# Patient Record
Sex: Female | Born: 1965 | Hispanic: Yes | Marital: Single | Smoking: Never smoker
Health system: Southern US, Community
[De-identification: ages and names within clinical notes are randomized; demographics above are authoritative.]

## PROBLEM LIST (undated history)

## (undated) DIAGNOSIS — Z789 Other specified health status: Secondary | ICD-10-CM

## (undated) HISTORY — PX: CHOLECYSTECTOMY: SHX55

---

## 1991-03-01 HISTORY — PX: WRIST SURGERY: SHX841

## 2010-02-28 NOTE — L&D Delivery Note (Addendum)
Delivery Note At 9:01 AM a viable female was delivered via Vaginal, Spontaneous Delivery (Presentation: Right Occiput Anterior).  APGAR: 9, 9; weight: 6lb5oz .   Placenta status: Intact, Spontaneous.  Cord: 3 vessel cord. No complications with shoulders. Fundus firm after massage.  Anesthesia: None  Episiotomy: None Lacerations: 1st degree;Vaginal Suture Repair: 3.0 vicryl Est. Blood Loss (mL): 350  Mom to postpartum.  Baby to nursery-stable.  Marena Chancy 09/28/2010, 9:26 AM  I was present for the delivery.  Agree with above note by PGY1.   Maryelizabeth Kaufmann MD 09/28/2010

## 2010-05-04 ENCOUNTER — Other Ambulatory Visit: Payer: Self-pay | Admitting: Family Medicine

## 2010-05-04 DIAGNOSIS — Z0489 Encounter for examination and observation for other specified reasons: Secondary | ICD-10-CM

## 2010-05-13 ENCOUNTER — Ambulatory Visit (HOSPITAL_COMMUNITY)
Admission: RE | Admit: 2010-05-13 | Discharge: 2010-05-13 | Disposition: A | Discharge status: Home / Self Care | Payer: Medicaid Other | Source: Ambulatory Visit | Attending: Family Medicine | Admitting: Family Medicine

## 2010-05-13 ENCOUNTER — Other Ambulatory Visit (HOSPITAL_COMMUNITY): Payer: Self-pay | Admitting: Family Medicine

## 2010-05-13 ENCOUNTER — Other Ambulatory Visit: Payer: Self-pay | Admitting: Family Medicine

## 2010-05-13 ENCOUNTER — Other Ambulatory Visit: Payer: Self-pay

## 2010-05-13 DIAGNOSIS — O09529 Supervision of elderly multigravida, unspecified trimester: Secondary | ICD-10-CM | POA: Insufficient documentation

## 2010-05-13 LAB — POCT URINALYSIS DIPSTICK
Nitrite: NEGATIVE
Protein, ur: NEGATIVE mg/dL
Specific Gravity, Urine: 1.02 (ref 1.005–1.030)
Urobilinogen, UA: 0.2 mg/dL (ref 0.0–1.0)

## 2010-05-14 LAB — ABO/RH: RH Type: POSITIVE

## 2010-05-14 LAB — TYPE AND SCREEN: Antibody Screen: NEGATIVE

## 2010-05-18 ENCOUNTER — Ambulatory Visit (HOSPITAL_COMMUNITY): Payer: Medicaid Other

## 2010-05-20 ENCOUNTER — Ambulatory Visit: Payer: Medicaid Other

## 2010-05-20 DIAGNOSIS — O09219 Supervision of pregnancy with history of pre-term labor, unspecified trimester: Secondary | ICD-10-CM

## 2010-05-27 ENCOUNTER — Ambulatory Visit: Payer: Medicaid Other

## 2010-05-27 DIAGNOSIS — O09529 Supervision of elderly multigravida, unspecified trimester: Secondary | ICD-10-CM

## 2010-05-27 DIAGNOSIS — Z331 Pregnant state, incidental: Secondary | ICD-10-CM

## 2010-06-03 ENCOUNTER — Ambulatory Visit: Payer: Medicaid Other

## 2010-06-10 ENCOUNTER — Other Ambulatory Visit: Payer: Self-pay | Admitting: Obstetrics & Gynecology

## 2010-06-10 DIAGNOSIS — O09219 Supervision of pregnancy with history of pre-term labor, unspecified trimester: Secondary | ICD-10-CM

## 2010-06-10 LAB — POCT URINALYSIS DIP (DEVICE)
Glucose, UA: NEGATIVE mg/dL
Protein, ur: NEGATIVE mg/dL
Specific Gravity, Urine: >=1.03 (ref 1.005–1.030)
pH: 6 (ref 5.0–8.0)

## 2010-06-17 ENCOUNTER — Ambulatory Visit: Payer: Self-pay

## 2010-06-17 DIAGNOSIS — O09219 Supervision of pregnancy with history of pre-term labor, unspecified trimester: Secondary | ICD-10-CM

## 2010-06-24 ENCOUNTER — Ambulatory Visit (HOSPITAL_COMMUNITY)
Admission: RE | Admit: 2010-06-24 | Discharge: 2010-06-24 | Disposition: A | Discharge status: Home / Self Care | Payer: BC Managed Care – PPO | Source: Ambulatory Visit | Attending: Family Medicine | Admitting: Family Medicine

## 2010-06-24 ENCOUNTER — Other Ambulatory Visit: Payer: Self-pay | Admitting: Family Medicine

## 2010-06-24 ENCOUNTER — Encounter (HOSPITAL_COMMUNITY): Payer: Self-pay

## 2010-06-24 DIAGNOSIS — O09529 Supervision of elderly multigravida, unspecified trimester: Secondary | ICD-10-CM

## 2010-06-24 DIAGNOSIS — O3400 Maternal care for unspecified congenital malformation of uterus, unspecified trimester: Secondary | ICD-10-CM

## 2010-06-24 DIAGNOSIS — Z8751 Personal history of pre-term labor: Secondary | ICD-10-CM | POA: Insufficient documentation

## 2010-06-24 DIAGNOSIS — Z331 Pregnant state, incidental: Secondary | ICD-10-CM

## 2010-06-24 DIAGNOSIS — O09219 Supervision of pregnancy with history of pre-term labor, unspecified trimester: Secondary | ICD-10-CM

## 2010-06-24 DIAGNOSIS — O09299 Supervision of pregnancy with other poor reproductive or obstetric history, unspecified trimester: Secondary | ICD-10-CM

## 2010-06-29 ENCOUNTER — Inpatient Hospital Stay (HOSPITAL_COMMUNITY): Admission: AD | Admit: 2010-06-29 | Payer: Self-pay | Source: Ambulatory Visit | Admitting: Obstetrics & Gynecology

## 2010-07-01 ENCOUNTER — Ambulatory Visit: Payer: Self-pay

## 2010-07-02 ENCOUNTER — Ambulatory Visit: Payer: Self-pay

## 2010-07-02 DIAGNOSIS — Z331 Pregnant state, incidental: Secondary | ICD-10-CM

## 2010-07-02 DIAGNOSIS — O09219 Supervision of pregnancy with history of pre-term labor, unspecified trimester: Secondary | ICD-10-CM

## 2010-07-08 ENCOUNTER — Other Ambulatory Visit: Payer: Self-pay | Admitting: Obstetrics & Gynecology

## 2010-07-08 DIAGNOSIS — Z331 Pregnant state, incidental: Secondary | ICD-10-CM

## 2010-07-08 DIAGNOSIS — O09219 Supervision of pregnancy with history of pre-term labor, unspecified trimester: Secondary | ICD-10-CM

## 2010-07-08 LAB — POCT URINALYSIS DIP (DEVICE)
Hgb urine dipstick: NEGATIVE
Ketones, ur: NEGATIVE mg/dL
Protein, ur: NEGATIVE mg/dL
Specific Gravity, Urine: >=1.03 (ref 1.005–1.030)
Urobilinogen, UA: 0.2 mg/dL (ref 0.0–1.0)

## 2010-07-15 ENCOUNTER — Ambulatory Visit: Payer: Medicaid Other

## 2010-07-15 DIAGNOSIS — O09219 Supervision of pregnancy with history of pre-term labor, unspecified trimester: Secondary | ICD-10-CM

## 2010-07-22 ENCOUNTER — Other Ambulatory Visit (HOSPITAL_COMMUNITY): Payer: Self-pay

## 2010-07-22 ENCOUNTER — Ambulatory Visit (HOSPITAL_COMMUNITY): Payer: Self-pay

## 2010-07-23 ENCOUNTER — Ambulatory Visit: Payer: Self-pay

## 2010-07-23 DIAGNOSIS — O09219 Supervision of pregnancy with history of pre-term labor, unspecified trimester: Secondary | ICD-10-CM

## 2010-07-29 ENCOUNTER — Other Ambulatory Visit: Payer: Self-pay | Admitting: Obstetrics & Gynecology

## 2010-07-29 ENCOUNTER — Ambulatory Visit (HOSPITAL_COMMUNITY)
Admission: RE | Admit: 2010-07-29 | Discharge: 2010-07-29 | Disposition: A | Discharge status: Home / Self Care | Payer: BC Managed Care – PPO | Source: Ambulatory Visit | Attending: Obstetrics & Gynecology | Admitting: Obstetrics & Gynecology

## 2010-07-29 ENCOUNTER — Ambulatory Visit (HOSPITAL_COMMUNITY): Payer: BC Managed Care – PPO

## 2010-07-29 ENCOUNTER — Other Ambulatory Visit: Payer: Self-pay | Admitting: Obstetrics and Gynecology

## 2010-07-29 DIAGNOSIS — O3400 Maternal care for unspecified congenital malformation of uterus, unspecified trimester: Secondary | ICD-10-CM | POA: Insufficient documentation

## 2010-07-29 DIAGNOSIS — O09219 Supervision of pregnancy with history of pre-term labor, unspecified trimester: Secondary | ICD-10-CM

## 2010-07-29 DIAGNOSIS — O09529 Supervision of elderly multigravida, unspecified trimester: Secondary | ICD-10-CM | POA: Insufficient documentation

## 2010-07-29 DIAGNOSIS — Z8751 Personal history of pre-term labor: Secondary | ICD-10-CM | POA: Insufficient documentation

## 2010-07-29 DIAGNOSIS — O09299 Supervision of pregnancy with other poor reproductive or obstetric history, unspecified trimester: Secondary | ICD-10-CM

## 2010-07-29 LAB — POCT URINALYSIS DIP (DEVICE)
Bilirubin Urine: NEGATIVE
Glucose, UA: NEGATIVE mg/dL
Nitrite: NEGATIVE
Specific Gravity, Urine: 1.01 (ref 1.005–1.030)
Urobilinogen, UA: 0.2 mg/dL (ref 0.0–1.0)
pH: 6.5 (ref 5.0–8.0)

## 2010-08-05 ENCOUNTER — Ambulatory Visit (HOSPITAL_COMMUNITY)
Admission: RE | Admit: 2010-08-05 | Discharge: 2010-08-05 | Disposition: A | Discharge status: Home / Self Care | Payer: BC Managed Care – PPO | Source: Ambulatory Visit | Attending: Family Medicine | Admitting: Family Medicine

## 2010-08-05 DIAGNOSIS — O09299 Supervision of pregnancy with other poor reproductive or obstetric history, unspecified trimester: Secondary | ICD-10-CM

## 2010-08-05 DIAGNOSIS — O09219 Supervision of pregnancy with history of pre-term labor, unspecified trimester: Secondary | ICD-10-CM

## 2010-08-05 DIAGNOSIS — O09529 Supervision of elderly multigravida, unspecified trimester: Secondary | ICD-10-CM | POA: Insufficient documentation

## 2010-08-05 DIAGNOSIS — O3400 Maternal care for unspecified congenital malformation of uterus, unspecified trimester: Secondary | ICD-10-CM | POA: Insufficient documentation

## 2010-08-05 DIAGNOSIS — Z8751 Personal history of pre-term labor: Secondary | ICD-10-CM | POA: Insufficient documentation

## 2010-08-05 LAB — POCT URINALYSIS DIP (DEVICE)
Bilirubin Urine: NEGATIVE
Hgb urine dipstick: NEGATIVE
Ketones, ur: NEGATIVE mg/dL
Protein, ur: NEGATIVE mg/dL
pH: 5.5 (ref 5.0–8.0)

## 2010-08-06 ENCOUNTER — Other Ambulatory Visit: Payer: Self-pay | Admitting: Family Medicine

## 2010-08-06 DIAGNOSIS — O3400 Maternal care for unspecified congenital malformation of uterus, unspecified trimester: Secondary | ICD-10-CM

## 2010-08-06 DIAGNOSIS — O09529 Supervision of elderly multigravida, unspecified trimester: Secondary | ICD-10-CM

## 2010-08-06 DIAGNOSIS — Z8751 Personal history of pre-term labor: Secondary | ICD-10-CM

## 2010-08-12 ENCOUNTER — Ambulatory Visit: Payer: Self-pay

## 2010-08-12 ENCOUNTER — Other Ambulatory Visit (HOSPITAL_COMMUNITY): Payer: Self-pay

## 2010-08-12 DIAGNOSIS — O09219 Supervision of pregnancy with history of pre-term labor, unspecified trimester: Secondary | ICD-10-CM

## 2010-08-19 ENCOUNTER — Other Ambulatory Visit (HOSPITAL_COMMUNITY): Payer: Self-pay

## 2010-08-20 ENCOUNTER — Other Ambulatory Visit: Payer: Self-pay

## 2010-08-20 DIAGNOSIS — O09529 Supervision of elderly multigravida, unspecified trimester: Secondary | ICD-10-CM

## 2010-08-20 DIAGNOSIS — O09219 Supervision of pregnancy with history of pre-term labor, unspecified trimester: Secondary | ICD-10-CM

## 2010-08-20 DIAGNOSIS — O364XX0 Maternal care for intrauterine death, not applicable or unspecified: Secondary | ICD-10-CM

## 2010-08-26 ENCOUNTER — Ambulatory Visit (HOSPITAL_COMMUNITY): Payer: Self-pay

## 2010-08-26 ENCOUNTER — Other Ambulatory Visit: Payer: Self-pay | Admitting: Obstetrics & Gynecology

## 2010-08-26 ENCOUNTER — Other Ambulatory Visit (HOSPITAL_COMMUNITY): Payer: Self-pay

## 2010-08-26 ENCOUNTER — Ambulatory Visit (HOSPITAL_COMMUNITY): Payer: BC Managed Care – PPO | Attending: Family Medicine

## 2010-08-26 DIAGNOSIS — O09219 Supervision of pregnancy with history of pre-term labor, unspecified trimester: Secondary | ICD-10-CM

## 2010-08-26 DIAGNOSIS — O09529 Supervision of elderly multigravida, unspecified trimester: Secondary | ICD-10-CM

## 2010-08-30 ENCOUNTER — Other Ambulatory Visit: Payer: BC Managed Care – PPO

## 2010-08-30 DIAGNOSIS — O09299 Supervision of pregnancy with other poor reproductive or obstetric history, unspecified trimester: Secondary | ICD-10-CM

## 2010-08-30 DIAGNOSIS — O09529 Supervision of elderly multigravida, unspecified trimester: Secondary | ICD-10-CM

## 2010-09-02 ENCOUNTER — Other Ambulatory Visit: Payer: BC Managed Care – PPO

## 2010-09-02 ENCOUNTER — Other Ambulatory Visit: Payer: Self-pay | Admitting: Obstetrics & Gynecology

## 2010-09-02 DIAGNOSIS — IMO0002 Reserved for concepts with insufficient information to code with codable children: Secondary | ICD-10-CM

## 2010-09-02 DIAGNOSIS — O09299 Supervision of pregnancy with other poor reproductive or obstetric history, unspecified trimester: Secondary | ICD-10-CM

## 2010-09-02 DIAGNOSIS — O09219 Supervision of pregnancy with history of pre-term labor, unspecified trimester: Secondary | ICD-10-CM

## 2010-09-02 DIAGNOSIS — O09529 Supervision of elderly multigravida, unspecified trimester: Secondary | ICD-10-CM

## 2010-09-06 ENCOUNTER — Ambulatory Visit (INDEPENDENT_AMBULATORY_CARE_PROVIDER_SITE_OTHER): Payer: BC Managed Care – PPO | Admitting: Family Medicine

## 2010-09-06 DIAGNOSIS — O09299 Supervision of pregnancy with other poor reproductive or obstetric history, unspecified trimester: Secondary | ICD-10-CM

## 2010-09-06 DIAGNOSIS — O09529 Supervision of elderly multigravida, unspecified trimester: Secondary | ICD-10-CM

## 2010-09-09 ENCOUNTER — Other Ambulatory Visit: Payer: Self-pay | Admitting: Obstetrics and Gynecology

## 2010-09-09 ENCOUNTER — Ambulatory Visit (INDEPENDENT_AMBULATORY_CARE_PROVIDER_SITE_OTHER): Payer: BC Managed Care – PPO | Admitting: Family Medicine

## 2010-09-09 DIAGNOSIS — O09529 Supervision of elderly multigravida, unspecified trimester: Secondary | ICD-10-CM

## 2010-09-09 DIAGNOSIS — O09219 Supervision of pregnancy with history of pre-term labor, unspecified trimester: Secondary | ICD-10-CM

## 2010-09-09 DIAGNOSIS — O09299 Supervision of pregnancy with other poor reproductive or obstetric history, unspecified trimester: Secondary | ICD-10-CM

## 2010-09-13 ENCOUNTER — Other Ambulatory Visit: Payer: BC Managed Care – PPO

## 2010-09-16 ENCOUNTER — Other Ambulatory Visit: Payer: Self-pay | Admitting: Obstetrics & Gynecology

## 2010-09-16 ENCOUNTER — Ambulatory Visit: Payer: BC Managed Care – PPO | Admitting: *Deleted

## 2010-09-16 DIAGNOSIS — O09299 Supervision of pregnancy with other poor reproductive or obstetric history, unspecified trimester: Secondary | ICD-10-CM

## 2010-09-16 DIAGNOSIS — O09529 Supervision of elderly multigravida, unspecified trimester: Secondary | ICD-10-CM

## 2010-09-16 DIAGNOSIS — O09219 Supervision of pregnancy with history of pre-term labor, unspecified trimester: Secondary | ICD-10-CM

## 2010-09-16 LAB — POCT URINALYSIS DIP (DEVICE)
Ketones, ur: NEGATIVE mg/dL
Protein, ur: NEGATIVE mg/dL
Specific Gravity, Urine: 1.015 (ref 1.005–1.030)
Urobilinogen, UA: 0.2 mg/dL (ref 0.0–1.0)
pH: 6 (ref 5.0–8.0)

## 2010-09-20 ENCOUNTER — Ambulatory Visit: Payer: BC Managed Care – PPO | Admitting: *Deleted

## 2010-09-20 ENCOUNTER — Other Ambulatory Visit: Payer: BC Managed Care – PPO

## 2010-09-20 DIAGNOSIS — O368190 Decreased fetal movements, unspecified trimester, not applicable or unspecified: Secondary | ICD-10-CM

## 2010-09-20 DIAGNOSIS — O09299 Supervision of pregnancy with other poor reproductive or obstetric history, unspecified trimester: Secondary | ICD-10-CM

## 2010-09-20 DIAGNOSIS — O09529 Supervision of elderly multigravida, unspecified trimester: Secondary | ICD-10-CM

## 2010-09-20 LAB — US OB COMP LESS 14 WKS

## 2010-09-23 ENCOUNTER — Ambulatory Visit: Payer: BC Managed Care – PPO | Admitting: *Deleted

## 2010-09-23 ENCOUNTER — Other Ambulatory Visit: Payer: Self-pay | Admitting: Obstetrics & Gynecology

## 2010-09-23 DIAGNOSIS — O09299 Supervision of pregnancy with other poor reproductive or obstetric history, unspecified trimester: Secondary | ICD-10-CM

## 2010-09-23 DIAGNOSIS — IMO0002 Reserved for concepts with insufficient information to code with codable children: Secondary | ICD-10-CM

## 2010-09-27 ENCOUNTER — Ambulatory Visit: Payer: BC Managed Care – PPO | Admitting: *Deleted

## 2010-09-27 DIAGNOSIS — O09529 Supervision of elderly multigravida, unspecified trimester: Secondary | ICD-10-CM

## 2010-09-27 DIAGNOSIS — O09299 Supervision of pregnancy with other poor reproductive or obstetric history, unspecified trimester: Secondary | ICD-10-CM

## 2010-09-28 ENCOUNTER — Inpatient Hospital Stay (HOSPITAL_COMMUNITY)
Admission: AD | Admit: 2010-09-28 | Discharge: 2010-09-29 | DRG: 373 | Disposition: A | Discharge status: Home / Self Care | Payer: BC Managed Care – PPO | Source: Ambulatory Visit | Attending: Obstetrics and Gynecology | Admitting: Obstetrics and Gynecology

## 2010-09-28 ENCOUNTER — Encounter (HOSPITAL_COMMUNITY): Payer: Self-pay

## 2010-09-28 DIAGNOSIS — O09529 Supervision of elderly multigravida, unspecified trimester: Secondary | ICD-10-CM

## 2010-09-28 DIAGNOSIS — Z3480 Encounter for supervision of other normal pregnancy, unspecified trimester: Secondary | ICD-10-CM

## 2010-09-28 HISTORY — DX: Other specified health status: Z78.9

## 2010-09-28 LAB — CBC
HCT: 40.7 % (ref 36.0–46.0)
Platelets: 232 10*3/uL (ref 150–400)
RBC: 4.7 MIL/uL (ref 3.87–5.11)
RDW: 13.1 % (ref 11.5–15.5)
WBC: 10.6 10*3/uL — ABNORMAL HIGH (ref 4.0–10.5)

## 2010-09-28 MED ORDER — IBUPROFEN 600 MG PO TABS
600.0000 mg | ORAL_TABLET | Freq: Four times a day (QID) | ORAL | Status: DC
  Administered 2010-09-28 – 2010-09-29 (×5): 600 mg via ORAL
  Filled 2010-09-28 (×5): qty 1

## 2010-09-28 MED ORDER — DIPHENHYDRAMINE HCL 25 MG PO CAPS: 25.0000 mg | ORAL_CAPSULE | Freq: Four times a day (QID) | ORAL | Status: DC | PRN

## 2010-09-28 MED ORDER — LACTATED RINGERS IV SOLN
INTRAVENOUS | Status: DC
  Administered 2010-09-28 (×2): via INTRAVENOUS

## 2010-09-28 MED ORDER — TETANUS-DIPHTH-ACELL PERTUSSIS 5-2.5-18.5 LF-MCG/0.5 IM SUSP
0.5000 mL | Freq: Once | INTRAMUSCULAR | Status: AC
  Administered 2010-09-29: 0.5 mL via INTRAMUSCULAR
  Filled 2010-09-28: qty 0.5

## 2010-09-28 MED ORDER — SODIUM CHLORIDE 0.9 % IJ SOLN: 3.0000 mL | Freq: Two times a day (BID) | INTRAMUSCULAR | Status: DC

## 2010-09-28 MED ORDER — SODIUM CHLORIDE 0.9 % IJ SOLN: 3.0000 mL | INTRAMUSCULAR | Status: DC | PRN

## 2010-09-28 MED ORDER — BENZOCAINE-MENTHOL 20-0.5 % EX AERO: 1.0000 "application " | INHALATION_SPRAY | CUTANEOUS | Status: DC | PRN

## 2010-09-28 MED ORDER — BENZOCAINE-MENTHOL 20-0.5 % EX AERO
INHALATION_SPRAY | CUTANEOUS | Status: AC
  Administered 2010-09-28: 13:00:00
  Filled 2010-09-28: qty 56

## 2010-09-28 MED ORDER — FLEET ENEMA 7-19 GM/118ML RE ENEM: 1.0000 | ENEMA | RECTAL | Status: DC | PRN

## 2010-09-28 MED ORDER — SODIUM CHLORIDE 0.9 % IV SOLN: 250.0000 mL | INTRAVENOUS | Status: DC

## 2010-09-28 MED ORDER — OXYCODONE-ACETAMINOPHEN 5-325 MG PO TABS: 1.0000 | ORAL_TABLET | ORAL | Status: DC | PRN

## 2010-09-28 MED ORDER — LIDOCAINE HCL (PF) 1 % IJ SOLN
INTRAMUSCULAR | Status: AC
  Filled 2010-09-28: qty 30

## 2010-09-28 MED ORDER — FERROUS SULFATE 325 (65 FE) MG PO TABS
325.0000 mg | ORAL_TABLET | Freq: Two times a day (BID) | ORAL | Status: DC
  Administered 2010-09-28 – 2010-09-29 (×2): 325 mg via ORAL
  Filled 2010-09-28 (×2): qty 1

## 2010-09-28 MED ORDER — LIDOCAINE HCL (PF) 1 % IJ SOLN
30.0000 mL | INTRAMUSCULAR | Status: DC | PRN
  Administered 2010-09-28: 30 mL via SUBCUTANEOUS

## 2010-09-28 MED ORDER — ONDANSETRON HCL 4 MG/2ML IJ SOLN: 4.0000 mg | Freq: Four times a day (QID) | INTRAMUSCULAR | Status: DC | PRN

## 2010-09-28 MED ORDER — OXYCODONE-ACETAMINOPHEN 5-325 MG PO TABS: 2.0000 | ORAL_TABLET | ORAL | Status: DC | PRN

## 2010-09-28 MED ORDER — PRENATAL PLUS 27-1 MG PO TABS
1.0000 | ORAL_TABLET | Freq: Every day | ORAL | Status: DC
  Administered 2010-09-29: 1 via ORAL
  Filled 2010-09-28: qty 1

## 2010-09-28 MED ORDER — LACTATED RINGERS IV SOLN: 500.0000 mL | INTRAVENOUS | Status: DC | PRN

## 2010-09-28 MED ORDER — WITCH HAZEL-GLYCERIN EX PADS: MEDICATED_PAD | CUTANEOUS | Status: DC | PRN

## 2010-09-28 MED ORDER — OXYTOCIN 20 UNITS IN LACTATED RINGERS INFUSION - SIMPLE
125.0000 mL/h | Freq: Once | INTRAVENOUS | Status: AC
  Administered 2010-09-28: 125 mL/h via INTRAVENOUS

## 2010-09-28 MED ORDER — SENNOSIDES-DOCUSATE SODIUM 8.6-50 MG PO TABS
1.0000 | ORAL_TABLET | Freq: Every day | ORAL | Status: DC
  Administered 2010-09-28: 1 via ORAL

## 2010-09-28 MED ORDER — SIMETHICONE 80 MG PO CHEW
80.0000 mg | CHEWABLE_TABLET | ORAL | Status: DC | PRN
  Administered 2010-09-29: 80 mg via ORAL

## 2010-09-28 MED ORDER — IBUPROFEN 600 MG PO TABS: 600.0000 mg | ORAL_TABLET | Freq: Four times a day (QID) | ORAL | Status: DC | PRN

## 2010-09-28 MED ORDER — OXYTOCIN 20 UNITS IN LACTATED RINGERS INFUSION - SIMPLE
INTRAVENOUS | Status: AC
  Filled 2010-09-28: qty 1000

## 2010-09-28 MED ORDER — ACETAMINOPHEN 325 MG PO TABS: 650.0000 mg | ORAL_TABLET | ORAL | Status: DC | PRN

## 2010-09-28 MED ORDER — LANOLIN HYDROUS EX OINT: TOPICAL_OINTMENT | CUTANEOUS | Status: DC | PRN

## 2010-09-28 MED ORDER — CITRIC ACID-SODIUM CITRATE 334-500 MG/5ML PO SOLN: 30.0000 mL | ORAL | Status: DC | PRN

## 2010-09-28 NOTE — H&P (Signed)
Janet Gentry is a 45 y.o. female [phone] with IUP at [redacted]w[redacted]d presenting for active labor . Pt states she has been having regular, every 2-3 minutes, associated with spotting vaginal bleeding, ruptured, clear fluid, @ 0620, with active.  She desires to unsure of pp contraception.  [facility] 21 w[facility]  Prenatal History/Complications: AMA Obesity H/o IUFD at 28 wks s/p 17-P  Past Medical History: Past Medical History  Diagnosis Date  . Preterm labor   . No pertinent past medical history     Past Surgical History: Past Surgical History  Procedure Date  . Wrist surgery 1993    Obstetrical History: OB History    Grav Para Term Preterm Abortions TAB SAB Ect Mult Living   5 4 3 1      3       Gynecological History: No STIs. Or HSV  Social History: History   Social History  . Marital Status: Single    Spouse Name: N/A    Number of Children: N/A  . Years of Education: N/A   Social History Main Topics  . Smoking status: Never Smoker   . Smokeless tobacco: Never Used  . Alcohol Use: No  . Drug Use: No  . Sexually Active: Yes   Other Topics Concern  . None   Social History Narrative  . None    Family History: Family History  Problem Relation Age of Onset  . Hypertension Father   . Stroke Father   . Hypertension Maternal Grandmother     Allergies: No Known Allergies  No prescriptions prior to admission    Review of Systems - General: no fevers HEENT: no headaches Cardiac: no chest pain, no edema GI: no nausea, no vomiting GU: positive for contractions, some vaginal bleeding, LOF    Blood pressure 139/80, pulse 101, temperature 97.9 F (36.6 C), temperature source Oral, resp. rate 22, height 5' (1.524 m), weight 89.359 kg (197 lb). General appearance: alert and cooperative Head: Normocephalic, without obvious abnormality, atraumatic Lungs: clear to auscultation bilaterally Heart: regular rate and rhythm, S1, S2 normal, no murmur, click, rub or  gallop Abdomen: gravid, nontender Extremities: no edema, redness or tenderness in the calves or thighs cephalic Baseline: 150 bpm, Variability: Good {> 6 bpm), Accelerations: Reactive and Decelerations: Variable: mild Frequency: Every 2-3 minutes Dilation 10cm Effacement 100% Station 0   Prenatal labs: ABO, Rh:  A+ Antibody:   negative Rubella:  immune RPR:   non reactive HBsAg:    negative HIV:   negative GBS: NEGATIVE (07/19 0839)  1 hr Glucola 120 Genetic screening normal Anatomy US normal   Assessment: Janet Gentry is a 45 y.o. [id number] with an IUP at [redacted]w[redacted]d presenting in active labor  Plan: Anticipate SVD, actively pushing.  Tavi Hoogendoorn 09/28/2010, 8:39 AM

## 2010-09-28 NOTE — Progress Notes (Addendum)
Infant recently fed per mom 8-10 minutes. . Discussed importance of checking latch /encouraged to call .

## 2010-09-28 NOTE — Progress Notes (Signed)
At 0630, water broke pinkish clear fluid, ctx's now q3 minutes apart. GBS negative.

## 2010-09-29 MED ORDER — IBUPROFEN 600 MG PO TABS: 600.0000 mg | ORAL_TABLET | Freq: Four times a day (QID) | ORAL | Status: AC

## 2010-09-29 MED ORDER — DOCUSATE SODIUM 100 MG PO CAPS: 100.0000 mg | ORAL_CAPSULE | Freq: Two times a day (BID) | ORAL | Status: AC | PRN

## 2010-09-29 NOTE — Discharge Summary (Signed)
Obstetric Discharge Summary Reason for Admission: onset of labor Prenatal Procedures: none Intrapartum Procedures: spontaneous vaginal delivery Postpartum Procedures: none Complications-Operative and Postpartum: 1st degree vaginal laceration repaired Hemoglobin  Date Value Range Status  09/28/2010 14.3  12.0-15.0 (g/dL) Final     HCT  Date Value Range Status  09/28/2010 40.7  36.0-46.0 (%) Final    Discharge Diagnoses: Term Pregnancy-delivered  Discharge Information: Date: 09/29/2010 Activity: pelvic rest Diet: routine Medications: PNV, Ibuprophen and Colace Condition: stable Instructions: refer to practice specific booklet Discharge to: home Breast feeding Contraception: depo Follow-up Information    Follow up with [facility]. Make an appointment in 2 weeks. (for only depo injection and then 6 week postpartum check)    Contact information:   [address] [phone]          Newborn Data: Live born female  Birth Weight: 6 lb 5.6 oz (2880 g) APGAR: 9, 9 Circumcision prior to discharge.  Home with mother.  Janet Gentry 09/29/2010, 7:50 AM

## 2010-09-29 NOTE — Progress Notes (Signed)
Post Partum Day 1 Subjective: no complaints and tolerating PO, normal lochia, present BM, present flatus, plans to breastfeed, Depo-Provera  Objective: Blood pressure 119/79, pulse 85, temperature 97.6 F (36.4 C), temperature source Oral, resp. rate 18, height 5' (1.524 m), weight 89.359 kg (197 lb), SpO2 98.00%, unknown if currently breastfeeding.  Physical Exam:  General: alert and cooperative Lochia: appropriate Chest: CTAB Heart: RRR no m/r/g Abdomen: +BS, soft, nontender,  Uterine Fundus: firm at umbilicus DVT Evaluation: No evidence of DVT seen on physical exam. Extremities: no c/c/e   Basename 09/28/10 0740  HGB 14.3  HCT 40.7    Assessment/Plan: Discharge home pt desires early discharge.  Hold discharge if baby not d/c'd   LOS: 1 day   Janet Gentry 09/29/2010, 7:45 AM

## 2010-09-29 NOTE — Progress Notes (Signed)
Mom reports that baby is nursing well most feedings. Does better on the left breast. Manual pump given with instructions- going back to work in 8 Janet Gentry. Plans to call [facility] about a pump for back to work. No questions at present. Reviewed engorgement protocol especially on right breast.

## 2010-09-30 ENCOUNTER — Ambulatory Visit (HOSPITAL_COMMUNITY): Admission: RE | Admit: 2010-09-30 | Payer: BC Managed Care – PPO | Source: Ambulatory Visit

## 2010-09-30 ENCOUNTER — Other Ambulatory Visit: Payer: BC Managed Care – PPO

## 2010-10-07 ENCOUNTER — Encounter (HOSPITAL_COMMUNITY): Payer: BC Managed Care – PPO

## 2010-11-10 ENCOUNTER — Encounter: Payer: Self-pay | Admitting: Advanced Practice Midwife

## 2010-11-10 ENCOUNTER — Ambulatory Visit (INDEPENDENT_AMBULATORY_CARE_PROVIDER_SITE_OTHER): Payer: BC Managed Care – PPO | Admitting: Family Medicine

## 2010-11-10 VITALS — BP 122/86 | HR 85 | Temp 98.7°F | Ht 60.0 in | Wt 180.4 lb

## 2010-11-10 DIAGNOSIS — IMO0001 Reserved for inherently not codable concepts without codable children: Secondary | ICD-10-CM

## 2010-11-10 DIAGNOSIS — Z3049 Encounter for surveillance of other contraceptives: Secondary | ICD-10-CM

## 2010-11-10 LAB — POCT PREGNANCY, URINE: Preg Test, Ur: NEGATIVE

## 2010-11-10 MED ORDER — MEDROXYPROGESTERONE ACETATE 150 MG/ML IM SUSP
150.0000 mg | INTRAMUSCULAR | Status: AC
  Administered 2010-11-10: 150 mg via INTRAMUSCULAR

## 2010-11-10 NOTE — Progress Notes (Signed)
  Subjective:    Patient ID: Janet Gentry, female    DOB: January 01, 1966, 45 y.o.   MRN: [id number]  HPI Janet Gentry is a 45 y.o. female who presents for a postpartum visit. She is 6 weeks postpartum following a spontaneous vaginal delivery. I have fully reviewed the prenatal and intrapartum course. Postpartum course has been normal. Baby's course has been normal. Baby is feeding by breast. Bleeding staining only. Bowel function is normal. Bladder function is normal. Patient is not sexually active. Contraception method is Depo-Provera injections. Postpartum depression screening: negative.  The following portions of the patient's history were reviewed and updated as appropriate: past family history, past surgical history and problem list.    Review of Systems A comprehensive review of systems was negative.     Objective:   Physical Exam   BP 122/86  Pulse 85  Temp(Src) 98.7 F (37.1 C) (Oral)  Ht 5' (1.524 m)  Wt 180 lb 6.4 oz (81.829 kg)  BMI 35.23 kg/m2  Breastfeeding? Yes  General:  alert  Abdomen: soft, non-tender; bowel sounds normal; no masses,  no organomegaly   Vulva:  normal  Vagina: normal vagina  Corpus: normal size, contour, position, consistency, mobility, non-tender  Adnexa:  normal adnexa         Assessment & Plan:  Normal postpartum exam.  1. Contraception: Depo-Provera injections 2. Follow up in: 1 year or as needed.

## 2011-02-02 ENCOUNTER — Ambulatory Visit: Payer: BC Managed Care – PPO

## 2012-04-06 ENCOUNTER — Emergency Department (HOSPITAL_COMMUNITY)
Admission: EM | Admit: 2012-04-06 | Discharge: 2012-04-06 | Disposition: A | Discharge status: Home / Self Care | Payer: BC Managed Care – PPO | Attending: Emergency Medicine | Admitting: Emergency Medicine

## 2012-04-06 ENCOUNTER — Emergency Department (HOSPITAL_COMMUNITY): Payer: BC Managed Care – PPO

## 2012-04-06 ENCOUNTER — Encounter (HOSPITAL_COMMUNITY): Payer: Self-pay | Admitting: Neurology

## 2012-04-06 DIAGNOSIS — Z8751 Personal history of pre-term labor: Secondary | ICD-10-CM | POA: Insufficient documentation

## 2012-04-06 DIAGNOSIS — R079 Chest pain, unspecified: Secondary | ICD-10-CM

## 2012-04-06 DIAGNOSIS — M79609 Pain in unspecified limb: Secondary | ICD-10-CM | POA: Insufficient documentation

## 2012-04-06 DIAGNOSIS — Z3202 Encounter for pregnancy test, result negative: Secondary | ICD-10-CM | POA: Insufficient documentation

## 2012-04-06 DIAGNOSIS — R072 Precordial pain: Secondary | ICD-10-CM | POA: Insufficient documentation

## 2012-04-06 LAB — URINALYSIS, ROUTINE W REFLEX MICROSCOPIC
Bilirubin Urine: NEGATIVE
Hgb urine dipstick: NEGATIVE
Specific Gravity, Urine: 1.008 (ref 1.005–1.030)
Urobilinogen, UA: 0.2 mg/dL (ref 0.0–1.0)

## 2012-04-06 LAB — CBC WITH DIFFERENTIAL/PLATELET
Basophils Absolute: 0 10*3/uL (ref 0.0–0.1)
Basophils Relative: 0 % (ref 0–1)
Eosinophils Absolute: 0.4 10*3/uL (ref 0.0–0.7)
Lymphs Abs: 2.3 10*3/uL (ref 0.7–4.0)
MCH: 30.7 pg (ref 26.0–34.0)
Neutrophils Relative %: 59 % (ref 43–77)
Platelets: 233 10*3/uL (ref 150–400)
RBC: 4.66 MIL/uL (ref 3.87–5.11)
RDW: 12.1 % (ref 11.5–15.5)

## 2012-04-06 LAB — BASIC METABOLIC PANEL
Calcium: 9 mg/dL (ref 8.4–10.5)
GFR calc Af Amer: >90 mL/min (ref >90)
GFR calc non Af Amer: >90 mL/min (ref >90)
Glucose, Bld: 98 mg/dL (ref 70–99)
Potassium: 3.6 mEq/L (ref 3.5–5.1)
Sodium: 138 mEq/L (ref 135–145)

## 2012-04-06 LAB — URINE MICROSCOPIC-ADD ON

## 2012-04-06 LAB — TROPONIN I: Troponin I: <0.3 ng/mL (ref <0.30)

## 2012-04-06 MED ORDER — METOPROLOL TARTRATE 25 MG PO TABS: 100.0000 mg | ORAL_TABLET | Freq: Once | ORAL | Status: DC

## 2012-04-06 NOTE — ED Notes (Signed)
Per ems- Last night pt states nausea no cp. This morning ate breakfast, while driving had sudden onset of upper center pressure radiation to left arm 5/10. States SOB. Last 10 mins when arrive at work pain sudsided. BP 170/90 initial. EKG SR unremarkable, given 324 aspirin , 20 gauge to right hand. 100% 2 L. Painfree at this time. Alert and oriented. CBG 133, HR 90.

## 2012-04-06 NOTE — ED Notes (Signed)
Patient transported to X-ray 

## 2012-04-06 NOTE — ED Provider Notes (Signed)
History     CSN: [id number]  Arrival date & time 04/06/12  0751   First MD Initiated Contact with Patient 04/06/12 [phone]      Chief Complaint  Patient presents with  . Chest Pain    (Consider location/radiation/quality/duration/timing/severity/associated sxs/prior treatment) HPI Pt reports last night she felt nauseated but did not have any vomiting. She was feeling better this morning, but on her way to work began to have moderate midsternal chest pressure with aching in L arm. No SOB, no nausea. Pain lasted about and resolved. She is pain free now. EMS gave her ASA. No prior history of same. No known HTN, DM or CAD. Does not smoke. PERC neg.   Past Medical History  Diagnosis Date  . Preterm labor   . No pertinent past medical history     Past Surgical History  Procedure Date  . Wrist surgery 1993    Family History  Problem Relation Age of Onset  . Hypertension Father   . Stroke Father   . Hypertension Maternal Grandmother     History  Substance Use Topics  . Smoking status: Never Smoker   . Smokeless tobacco: Never Used  . Alcohol Use: No    OB History    Grav Para Term Preterm Abortions TAB SAB Ect Mult Living   5 5 4 1      4       Review of Systems All other systems reviewed and are negative except as noted in HPI.   Allergies  Review of patient's allergies indicates no known allergies.  Home Medications   Current Outpatient Rx  Name  Route  Sig  Dispense  Refill  . PRENATAL PLUS 27-1 MG PO TABS   Oral   Take 1 tablet by mouth daily.             BP 116/63  Pulse 93  Temp 97.9 F (36.6 C) (Oral)  Resp 16  Ht 5\' 2"  (1.575 m)  Wt 180 lb (81.647 kg)  BMI 32.92 kg/m2  SpO2 98%  LMP 03/14/2012  Physical Exam  Nursing note and vitals reviewed. Constitutional: She is oriented to person, place, and time. She appears well-developed and well-nourished.  HENT:  Head: Normocephalic and atraumatic.  Eyes: EOM are normal. Pupils are equal,  round, and reactive to light.  Neck: Normal range of motion. Neck supple.  Cardiovascular: Normal rate, normal heart sounds and intact distal pulses.   Pulmonary/Chest: Effort normal and breath sounds normal.  Abdominal: Bowel sounds are normal. She exhibits no distension. There is no tenderness.  Musculoskeletal: Normal range of motion. She exhibits no edema and no tenderness.  Neurological: She is alert and oriented to person, place, and time. She has normal strength. No cranial nerve deficit or sensory deficit.  Skin: Skin is warm and dry. No rash noted.  Psychiatric: She has a normal mood and affect.    ED Course  Procedures (including critical care time)  Labs Reviewed  URINALYSIS, ROUTINE W REFLEX MICROSCOPIC - Abnormal; Notable for the following:    Leukocytes, UA TRACE (*)     All other components within normal limits  URINE MICROSCOPIC-ADD ON - Abnormal; Notable for the following:    Squamous Epithelial / LPF FEW (*)     All other components within normal limits  CBC WITH DIFFERENTIAL  BASIC METABOLIC PANEL  TROPONIN I  PREGNANCY, URINE  TROPONIN I   Dg Chest 2 View  04/06/2012  *RADIOLOGY REPORT*  Clinical Data:  Chest pain and pressure.  CHEST - 2 VIEW  Comparison: None.  Findings: The heart size and pulmonary vascularity are normal and the lungs are clear.  No osseous abnormality.  IMPRESSION: Normal chest.   Original Report Authenticated By: Francene Boyers, M.D.      No diagnosis found.    MDM   Date: 04/06/2012  Rate: 82  Rhythm: normal sinus rhythm  QRS Axis: normal  Intervals: normal  ST/T Wave abnormalities: normal  Conduction Disutrbances: none  Narrative Interpretation: unremarkable  3:03 PM Pt remains pain free. Troponin neg x2. Low risk for CAD, I attempted to obtain CT Coronary today but after several delays it seems apparent that the test will not be done until tomorrow morning. The patient does not want to stay in the ED and will follow up with her  PCP. Advised to return for worsening.          Charles B. Bernette Mayers, MD 04/06/12 1504

## 2012-04-06 NOTE — ED Notes (Signed)
Phlebotomy at bedside to draw troponin.

## 2012-04-06 NOTE — ED Notes (Signed)
Pt resting talking with family at bedside

## 2012-04-06 NOTE — ED Notes (Signed)
Calculated BMI is 32.9  

## 2012-04-06 NOTE — ED Notes (Signed)
Family at bedside. 

## 2012-04-14 ENCOUNTER — Other Ambulatory Visit: Payer: Self-pay

## 2012-11-26 IMAGING — US US OB FOLLOW-UP
1 series · 14 of 28 positions shown · non-contrast
Comparison: none

[Series 1: us ob follow-up · 14 of 55 slices shown]
[im 3/55]
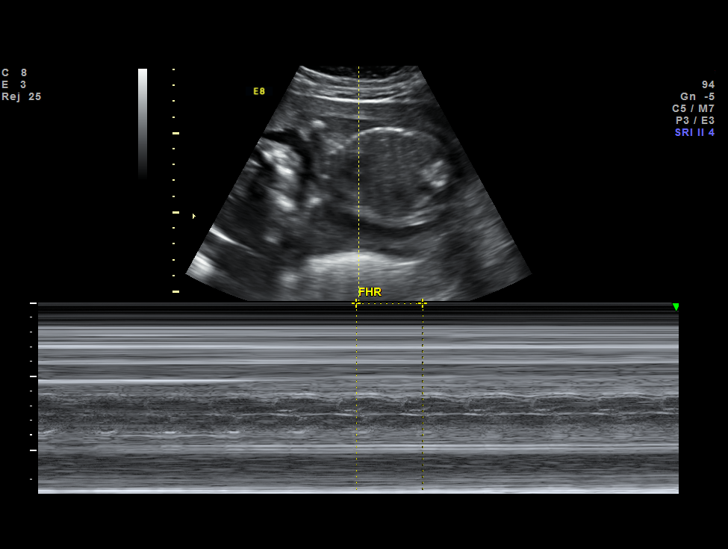
[im 7/55]
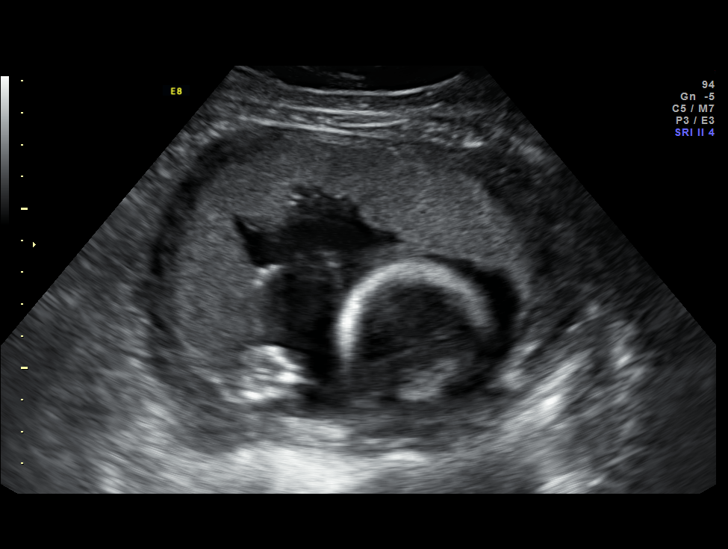
[im 11/55]
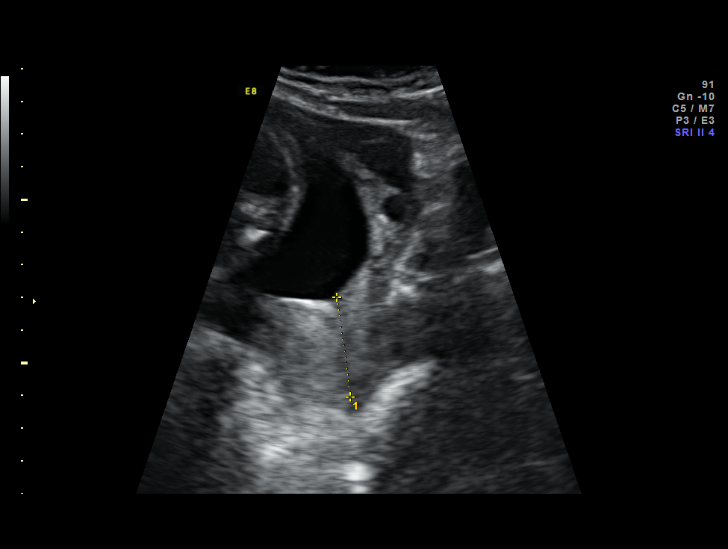
[im 15/55]
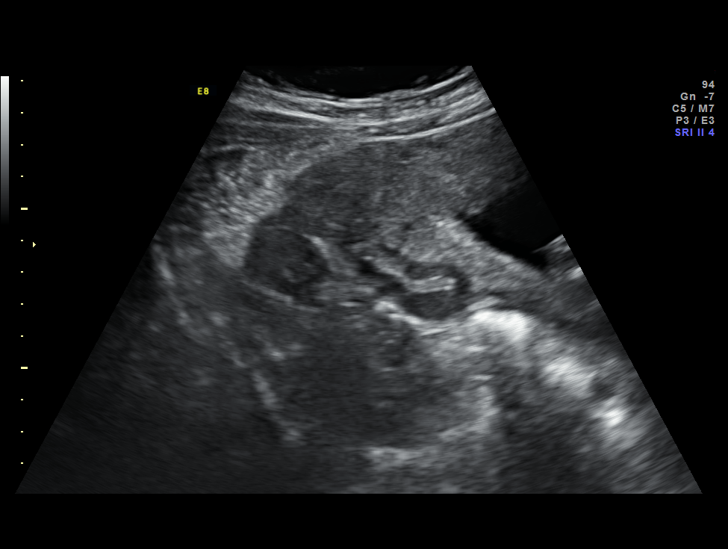
[im 19/55]
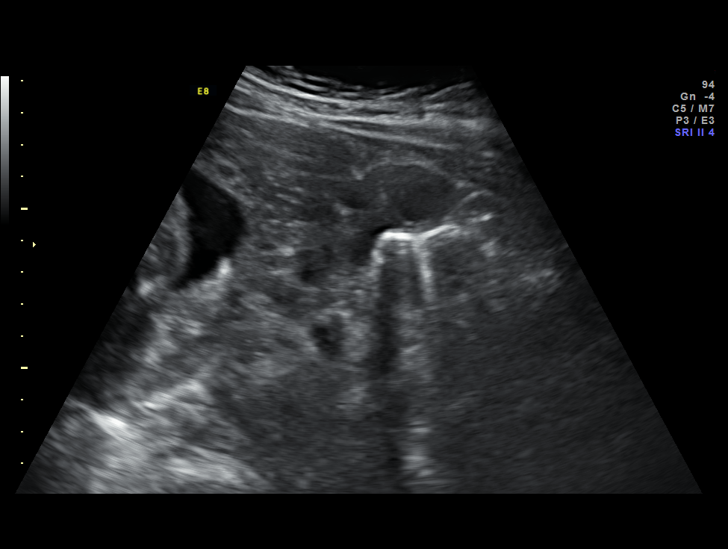
[im 23/55]
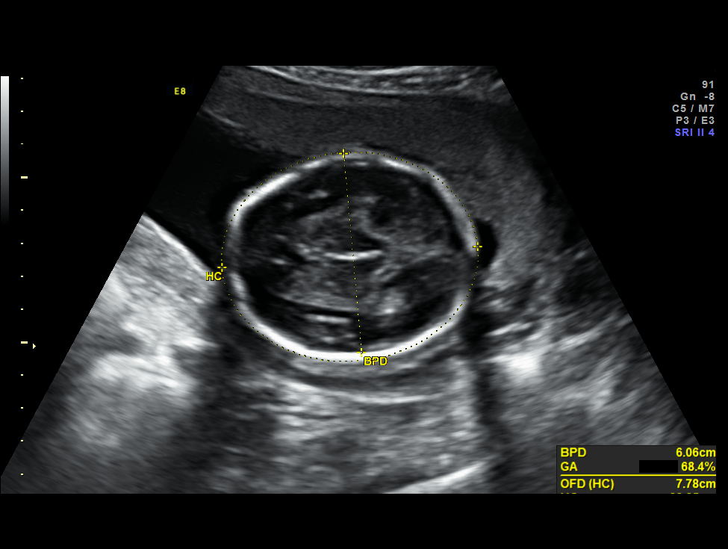
[im 27/55]
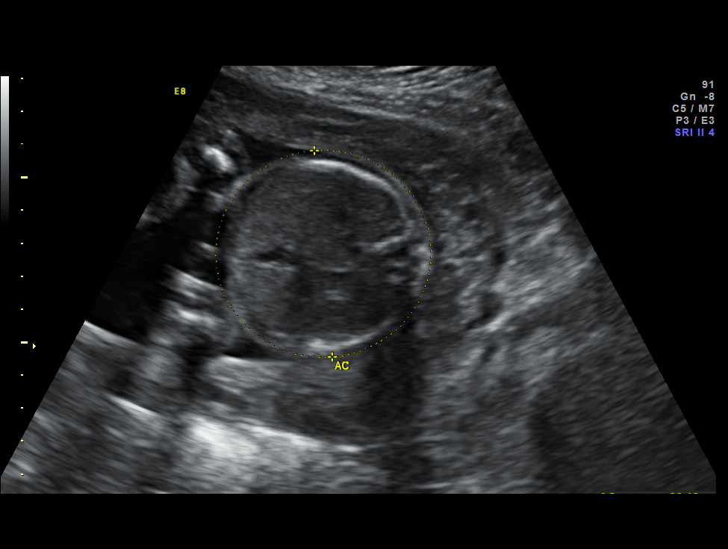
[im 31/55]
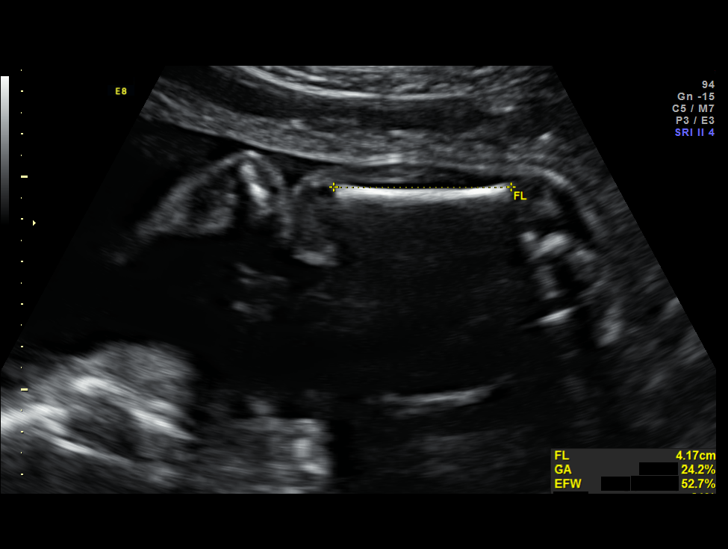
[im 35/55]
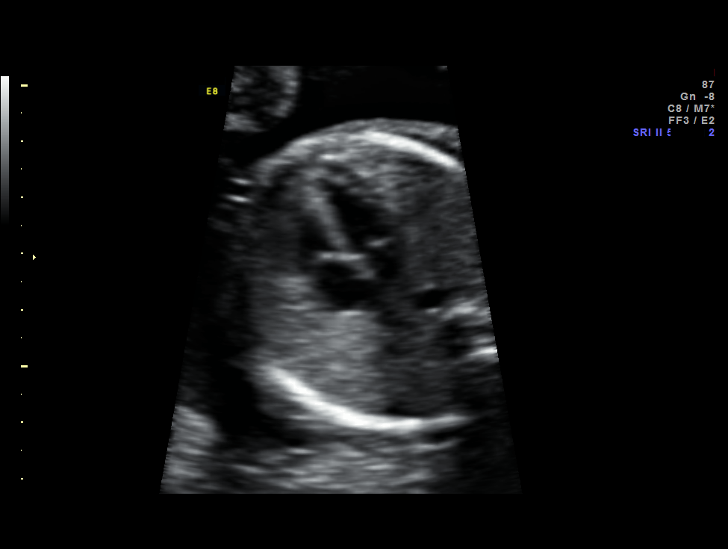
[im 39/55]
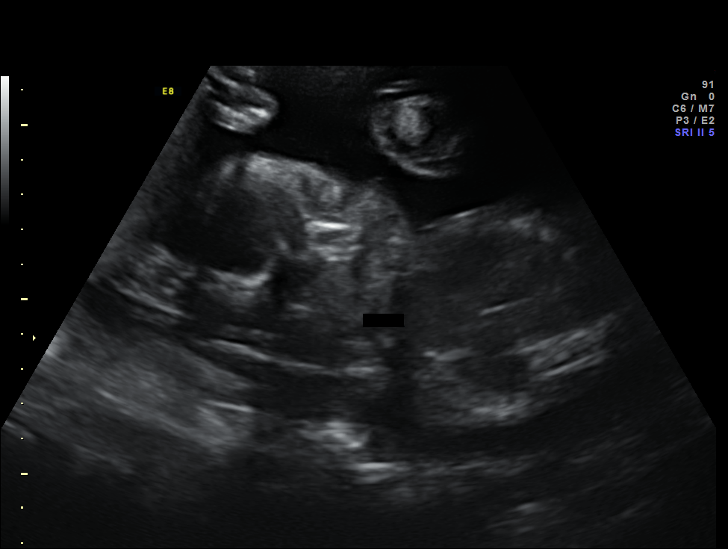
[im 43/55]
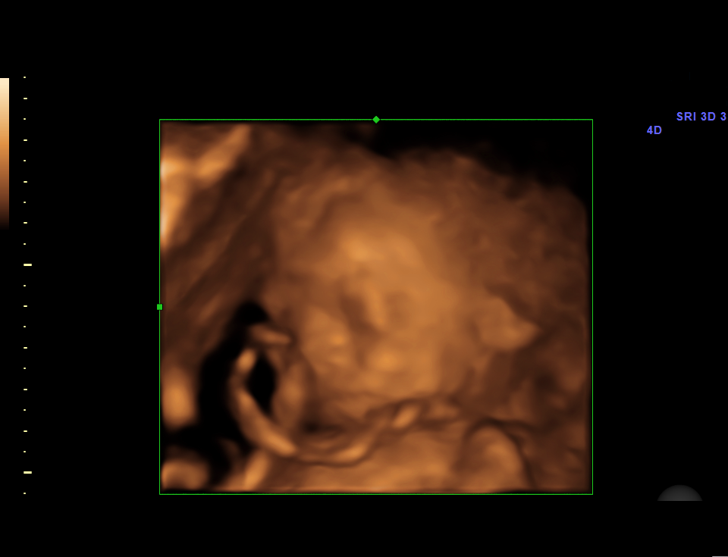
[im 47/55]
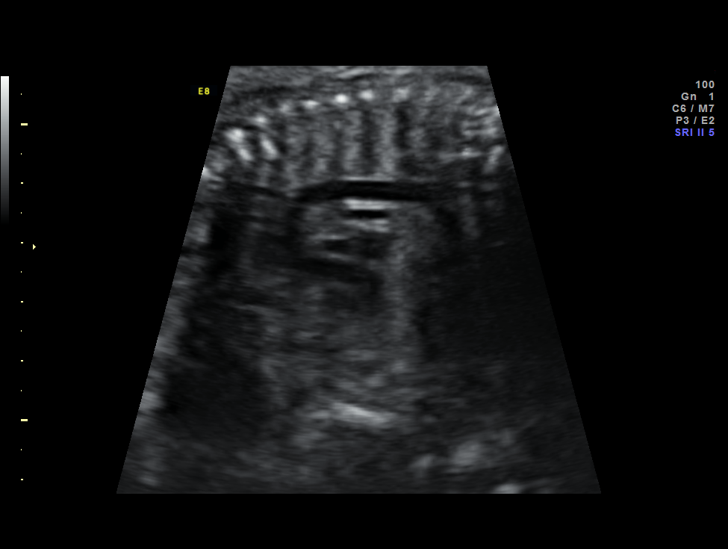
[im 51/55]
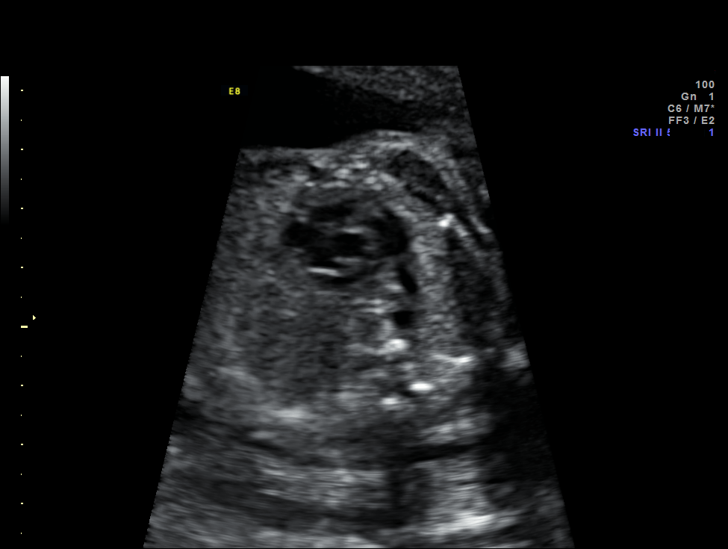
[im 55/55]
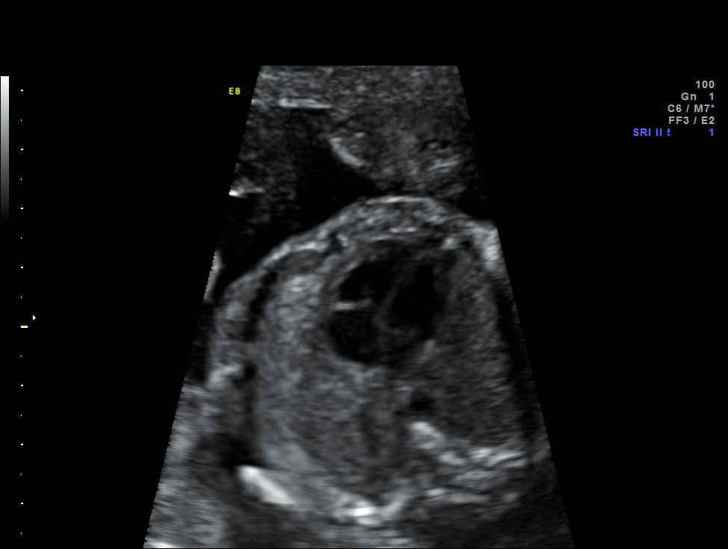

[14 of 28 positions shown; findings below may reference images not displayed]

Canned report from images found in remote index.

Refer to host system for actual result text.

## 2012-12-31 IMAGING — US US FETAL BPP W/O NONSTRESS
1 series · 12 of 28 positions shown · non-contrast
Comparison: none

[Series 1: us ob follow up · 45 acquisitions, 12 frames shown]
[im 2/45]
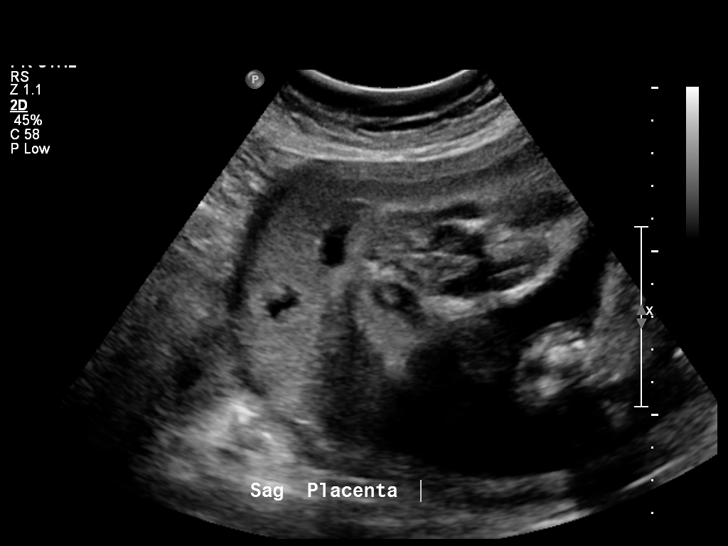
[im 5/45]
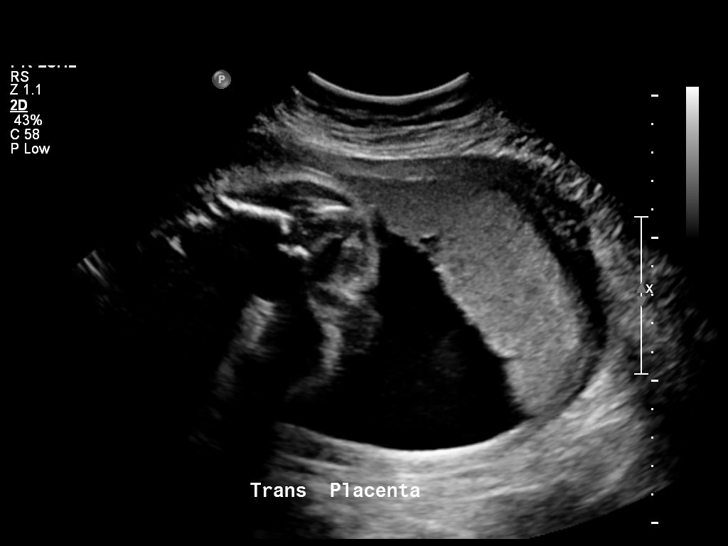
[im 9/45]
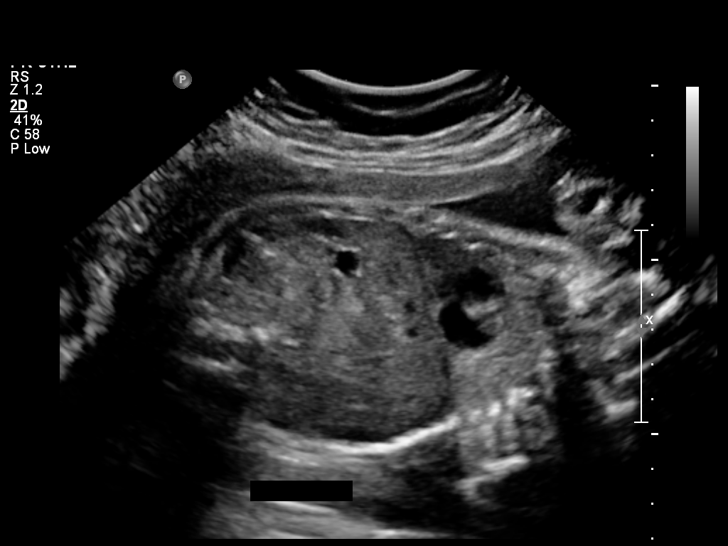
[im 14/45]
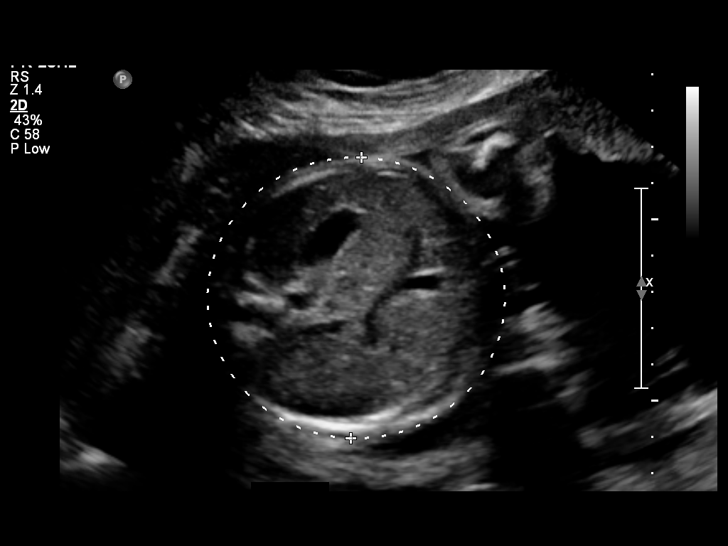
[im 17/45]
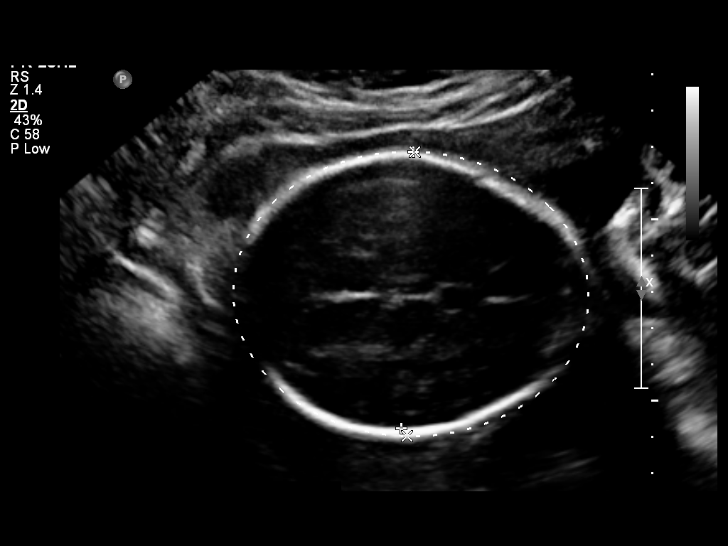
[im 20/45]
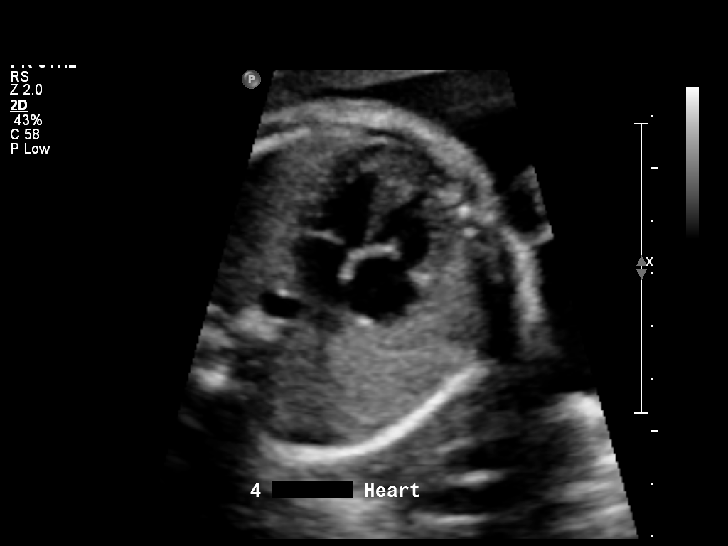
[im 25/45]
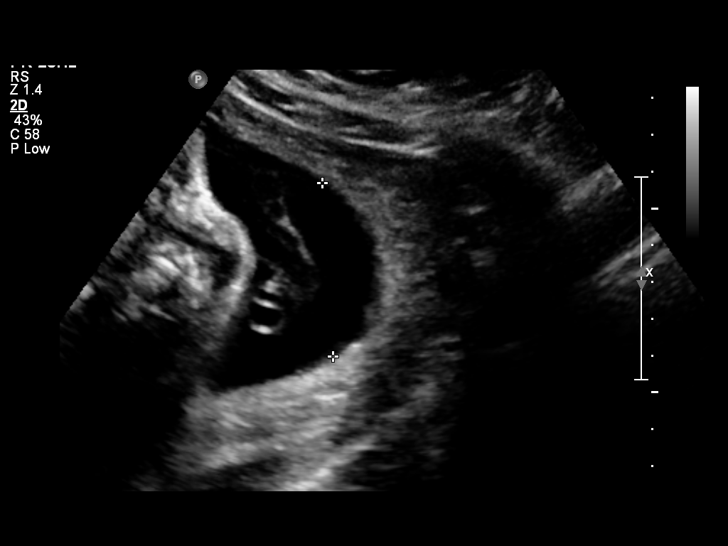
[im 28/45]
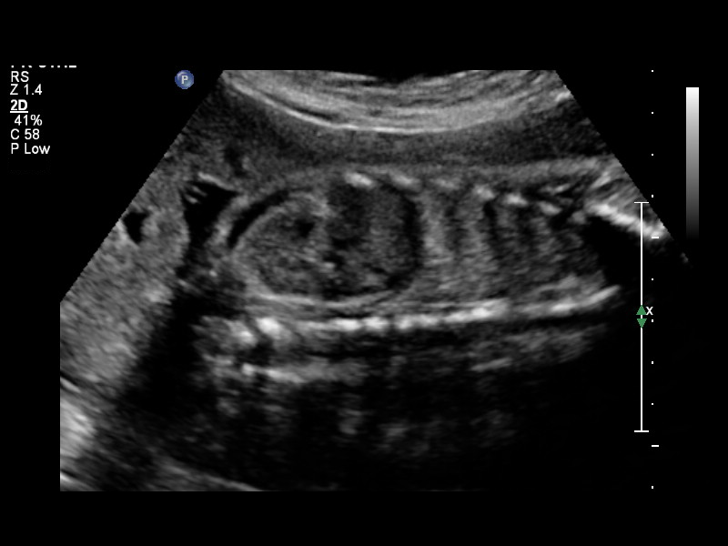
[im 31/45]
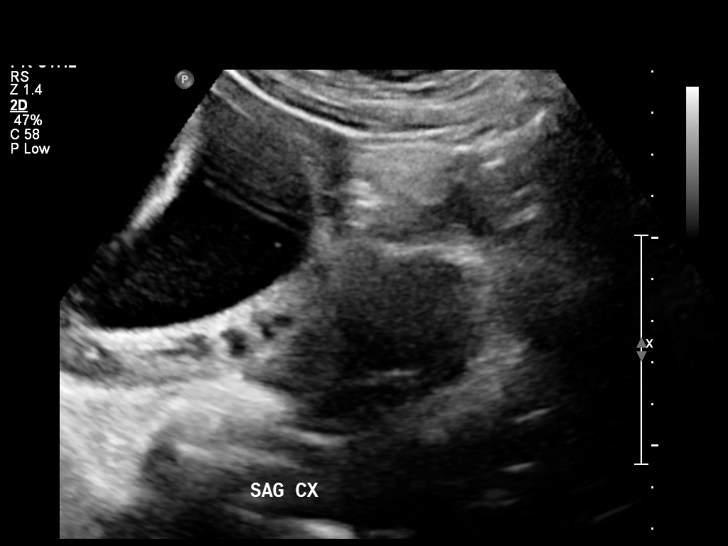
[im 36/45]
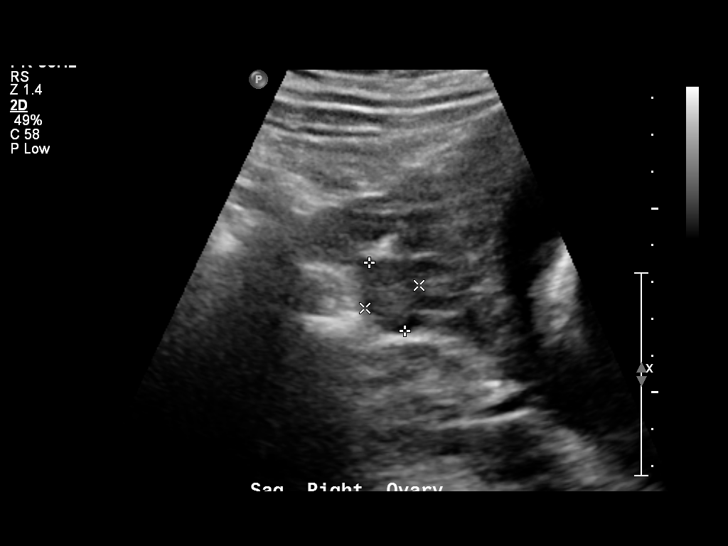
[im 40/45]
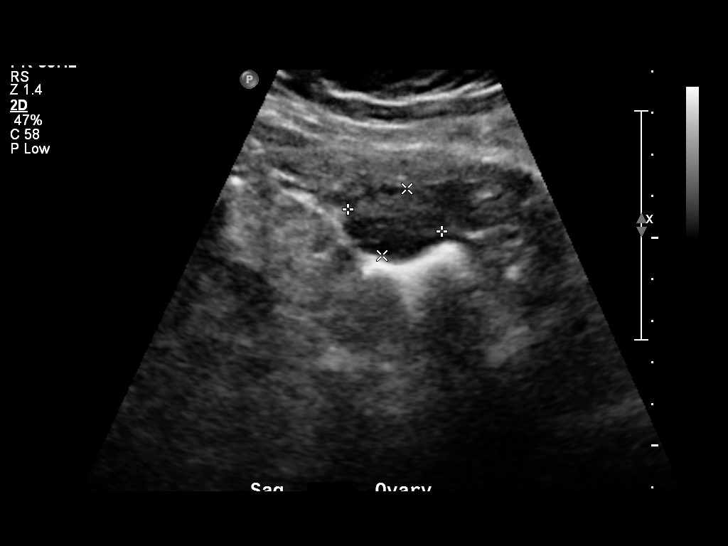
[im 43/45]
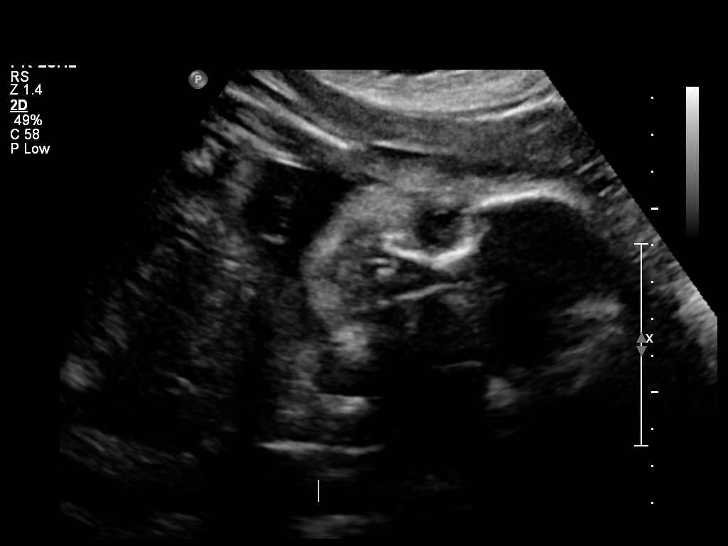

[12 of 28 positions shown; findings below may reference images not displayed]

OBSTETRICS REPORT
                      (Signed Final 07/29/2010 [DATE])

 Order#:         [id number]
                 47_O
Procedures

 US OB FOLLOW UP                                       76816.1
Indications

 Advanced maternal age (AMA), Multigravida
 Poor obstetric history: Previous preterm delivery
 Uterine malformation
 Assess Fetal Growth / Estimated Fetal Weight
Fetal Evaluation

 Fetal Heart Rate:  160                          bpm
 Cardiac Activity:  Observed
 Presentation:      Variable
 Placenta:          Anterior Fundal, above
                    cervical os
 P. Cord            Previously Visualized
 Insertion:

 Amniotic Fluid
 AFI FV:      Subjectively within normal limits
 AFI Sum:     14.02   cm       46  %Tile     Larg Pckt:    5.03  cm
 RLQ:   5.03    cm   LUQ:    4.28   cm    LLQ:   4.71    cm
Biophysical Evaluation

 Amniotic F.V:   Pocket => 2 cm two         F. Tone:        Observed
                 planes
 F. Movement:    Observed                   Score:          [DATE]
 F. Breathing:   Observed
Biometry

 BPD:     75.6  mm     G. Age:  30w 2d                CI:        73.72   70 - 86
                                                      FL/HC:      19.6   19.6 -

 HC:     279.7  mm     G. Age:  30w 4d       66  %    HC/AC:      1.14   0.99 -

 AC:     244.9  mm     G. Age:  28w 5d       36  %    FL/BPD:     72.6   71 - 87
 FL:      54.9  mm     G. Age:  29w 0d       35  %    FL/AC:      22.4   20 - 24

 Est. FW:    8118  gm    2 lb 15 oz      53  %
Gestational Age

 LMP:           29w 0d        Date:  01/07/10                 EDD:   10/14/10
 U/S Today:     29w 4d                                        EDD:   10/10/10
 Best:          29w 0d     Det. By:  LMP  (01/07/10)          EDD:   10/14/10
Anatomy

 Cranium:           Appears normal      Aortic Arch:       Previously seen
 Fetal Cavum:       Appears normal      Ductal Arch:       Previously seen
 Ventricles:        Appears normal      Diaphragm:         Appears normal
 Choroid Plexus:    Previously seen     Stomach:           Appears
                                                           normal, left
                                                           sided
 Cerebellum:        Appears normal      Abdomen:           Appears normal
 Posterior Fossa:   Appears normal      Abdominal Wall:    Previously seen
 Nuchal Fold:       Not applicable      Cord Vessels:      Previously seen
                    (>20 wks GA)
 Face:              Previously seen     Kidneys:           Appear normal
 Heart:             Appears normal      Bladder:           Appears normal
                    (4 chamber &
                    axis)
 RVOT:              Appears normal      Spine:             Previously seen
 LVOT:              Appears normal      Limbs:             Previously seen

 Other:     Male gender. Heels, 5th digit, and Nasal bone previously
            seen.
Cervix Uterus Adnexa

 Cervical Length:    3.8      cm

 Cervix:       Normal appearance by transabdominal scan.

 Left Ovary:    Within normal limits.
 Right Ovary:   Within normal limits.
 Adnexa:     No abnormality visualized.
Impression

   Single living intrauterine gestation  with concordant
 gestational age and fetal indices.

  [DATE] BPP.

 questions or concerns.

## 2013-01-03 ENCOUNTER — Other Ambulatory Visit: Payer: Self-pay

## 2013-12-30 ENCOUNTER — Encounter (HOSPITAL_COMMUNITY): Payer: Self-pay | Admitting: Neurology

## 2014-09-09 IMAGING — CR DG CHEST 2V
2 series · 2 of 2 positions shown · non-contrast
Comparison: None.

CLINICAL DATA: Chest pain and pressure.

CHEST - 2 VIEW

[w chest pa]
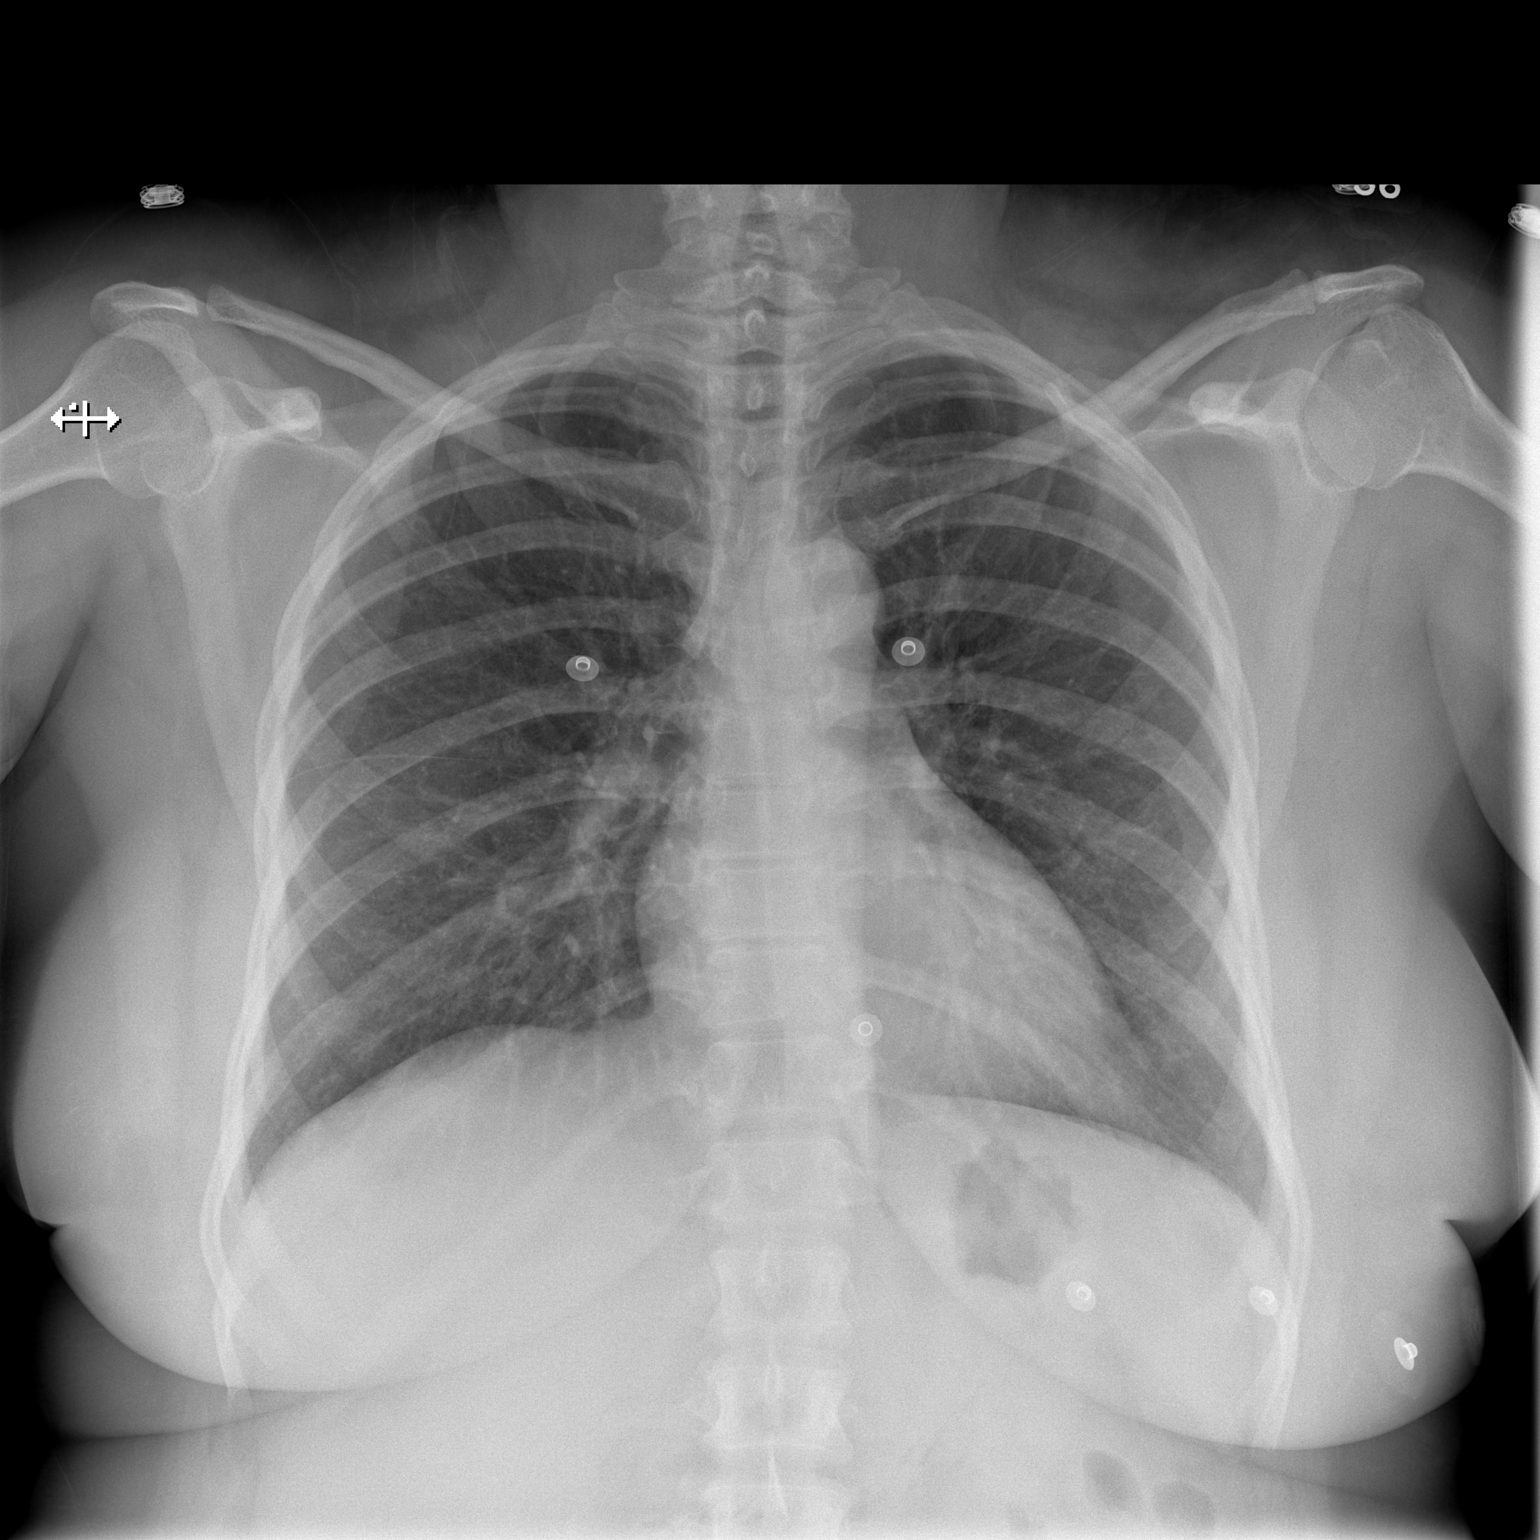

[w chest lat]
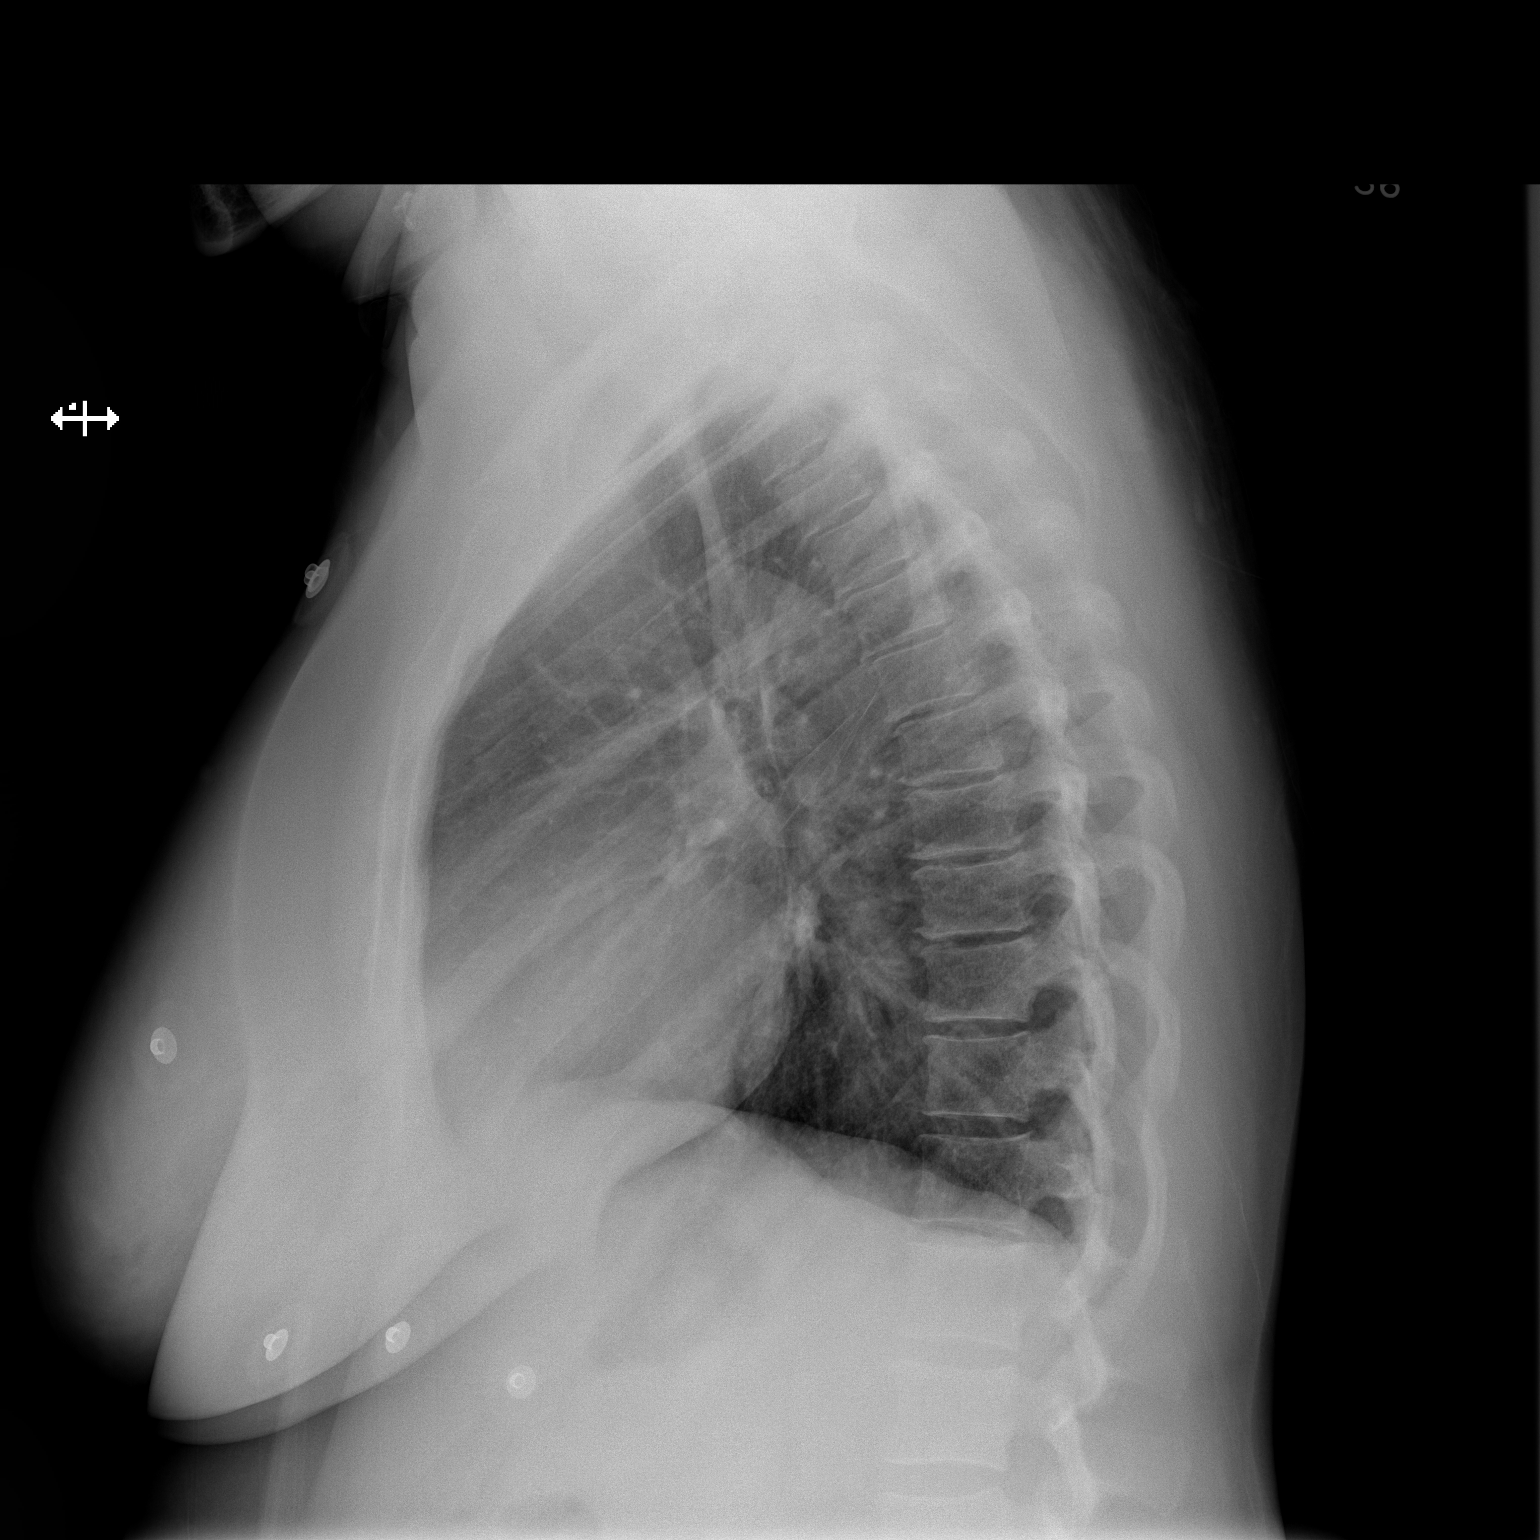

[2 of 2 positions shown; findings below may reference images not displayed]

FINDINGS: The heart size and pulmonary vascularity are normal and
the lungs are clear.  No osseous abnormality.
IMPRESSION: Normal chest.

## 2021-04-15 ENCOUNTER — Ambulatory Visit: Payer: BC Managed Care – PPO | Admitting: Family

## 2021-04-15 NOTE — Progress Notes (Unsigned)
° °  Subjective:     Patient ID: Janet Gentry, female    DOB: 26-Apr-1965, 56 y.o.   MRN: [id number]  No chief complaint on file.   HPI:  Health Maintenance Due  Topic Date Due   HIV Screening  Never done   Hepatitis C Screening  Never done   PAP SMEAR-Modifier  Never done   COLONOSCOPY (Pts 45-61yrs Insurance coverage will need to be confirmed)  Never done   MAMMOGRAM  Never done   Zoster Vaccines- Shingrix (1 of 2) Never done   COVID-19 Vaccine (4 - Booster for Pfizer series) 04/04/2020   INFLUENZA VACCINE  09/28/2020   TETANUS/TDAP  09/28/2020    Past Medical History:  Diagnosis Date   No pertinent past medical history    Preterm labor     Past Surgical History:  Procedure Laterality Date   WRIST SURGERY  1993    Outpatient Medications Prior to Visit  Medication Sig Dispense Refill   diclofenac (VOLTAREN) 75 MG EC tablet Take 75 mg by mouth 2 (two) times daily.     No facility-administered medications prior to visit.    No Known Allergies      Objective:    Physical Exam  There were no vitals taken for this visit. Wt Readings from Last 3 Encounters:  04/06/12 180 lb (81.6 kg)  11/10/10 180 lb 6.4 oz (81.8 kg)  09/28/10 197 lb (89.4 kg)       Assessment & Plan:   Problem List Items Addressed This Visit   None   No orders of the defined types were placed in this encounter.

## 2022-04-22 ENCOUNTER — Other Ambulatory Visit: Payer: Self-pay | Admitting: Family Medicine

## 2022-04-22 DIAGNOSIS — Z1239 Encounter for other screening for malignant neoplasm of breast: Secondary | ICD-10-CM

## 2022-06-01 ENCOUNTER — Encounter (HOSPITAL_BASED_OUTPATIENT_CLINIC_OR_DEPARTMENT_OTHER): Payer: Self-pay

## 2022-06-01 DIAGNOSIS — G4733 Obstructive sleep apnea (adult) (pediatric): Secondary | ICD-10-CM

## 2022-06-14 ENCOUNTER — Ambulatory Visit: Payer: BC Managed Care – PPO

## 2022-06-28 ENCOUNTER — Other Ambulatory Visit (HOSPITAL_BASED_OUTPATIENT_CLINIC_OR_DEPARTMENT_OTHER): Payer: Self-pay

## 2022-06-28 DIAGNOSIS — G4733 Obstructive sleep apnea (adult) (pediatric): Secondary | ICD-10-CM

## 2022-07-12 ENCOUNTER — Ambulatory Visit: Payer: PRIVATE HEALTH INSURANCE | Admitting: Dermatology

## 2022-10-04 ENCOUNTER — Encounter: Payer: Self-pay | Admitting: Adult Health

## 2023-04-17 ENCOUNTER — Telehealth: Payer: Self-pay | Admitting: Plastic Surgery

## 2023-04-17 NOTE — Telephone Encounter (Signed)
 Moved due to [place] being out of office

## 2023-06-09 ENCOUNTER — Institutional Professional Consult (permissible substitution): Payer: No Typology Code available for payment source | Admitting: Plastic Surgery

## 2023-06-27 ENCOUNTER — Institutional Professional Consult (permissible substitution): Payer: No Typology Code available for payment source | Admitting: Plastic Surgery

## 2023-09-22 ENCOUNTER — Ambulatory Visit (HOSPITAL_COMMUNITY)
Admission: EM | Admit: 2023-09-22 | Discharge: 2023-09-22 | Disposition: A | Discharge status: Home / Self Care | Payer: Self-pay | Attending: Internal Medicine | Admitting: Internal Medicine

## 2023-09-22 ENCOUNTER — Encounter (HOSPITAL_COMMUNITY): Payer: Self-pay | Admitting: *Deleted

## 2023-09-22 DIAGNOSIS — S39012A Strain of muscle, fascia and tendon of lower back, initial encounter: Secondary | ICD-10-CM

## 2023-09-22 DIAGNOSIS — K5900 Constipation, unspecified: Secondary | ICD-10-CM

## 2023-09-22 LAB — POCT URINALYSIS DIP (MANUAL ENTRY)
Bilirubin, UA: NEGATIVE
Blood, UA: NEGATIVE
Glucose, UA: NEGATIVE mg/dL
Ketones, POC UA: NEGATIVE mg/dL
Leukocytes, UA: NEGATIVE
Nitrite, UA: NEGATIVE
Protein Ur, POC: NEGATIVE mg/dL
Spec Grav, UA: <=1.005 — AB (ref 1.010–1.025)
Urobilinogen, UA: 0.2 U/dL
pH, UA: 6 (ref 5.0–8.0)

## 2023-09-22 MED ORDER — PREDNISONE 20 MG PO TABS: 40.0000 mg | ORAL_TABLET | Freq: Every day | ORAL | 0 refills | Status: AC

## 2023-09-22 NOTE — ED Provider Notes (Signed)
 [facility]    CSN: [id number] Arrival date & time: 09/22/23  1105      History   Chief Complaint Chief Complaint  Patient presents with   Back Pain   Constipation    HPI Janet Gentry is a 58 y.o. female.   58 year old female who presents urgent care with complaints of mild constipation and lower back pain.  Her symptoms started on Tuesday.  She reports she was having mostly regular bowel movements prior to this but her bowel movements feel like now she is not completely emptying her bowels.  She denies any blood per rectum, nausea, vomiting, severe abdominal pain, poor appetite, change in weight or recent illness.  She is trying to increase her water intake.  She has been straining to have bowel movements and noted an increase in back pain due to this.  She denies any dysuria, hematuria, difficulty urinating.  She has not had any injuries to the back that she is aware of.  She has had a little bit of constipation in the past but nothing chronic.  She did have a colonoscopy recently that was normal.   Back Pain Associated symptoms: abdominal pain (lower)   Associated symptoms: no chest pain, no dysuria and no fever   Constipation Associated symptoms: abdominal pain (lower) and back pain   Associated symptoms: no diarrhea, no dysuria, no fever, no nausea and no vomiting     Past Medical History:  Diagnosis Date   No pertinent past medical history    Preterm labor     There are no active problems to display for this patient.   Past Surgical History:  Procedure Laterality Date   CHOLECYSTECTOMY     WRIST SURGERY  03/01/1991    OB History     Gravida  5   Para  5   Term  4   Preterm  1   AB      Living  4      SAB      IAB      Ectopic      Multiple      Live Births  1            Home Medications    Prior to Admission medications   Medication Sig Start Date End Date Taking? Authorizing Provider  predniSONE (DELTASONE) 20 MG  tablet Take 2 tablets (40 mg total) by mouth daily with breakfast for 5 days. 09/22/23 09/27/23 Yes Elisa Kutner A, PA-C  diclofenac (VOLTAREN) 75 MG EC tablet Take 75 mg by mouth 2 (two) times daily.    [provider]  medroxyPROGESTERone  (DEPO-PROVERA ) 150 MG/ML injection Inject 150 mg into the muscle every 3 (three) months.    11/10/10  [provider]    Family History Family History  Problem Relation Age of Onset   Hypertension Father    Stroke Father    Hypertension Maternal Grandmother     Social History Social History   Tobacco Use   Smoking status: Never   Smokeless tobacco: Never  Vaping Use   Vaping status: Never Used  Substance Use Topics   Alcohol use: No   Drug use: No     Allergies   Lisinopril   Review of Systems Review of Systems  Constitutional:  Negative for appetite change, chills and fever.  HENT:  Negative for congestion, ear pain and sore throat.   Eyes:  Negative for pain and visual disturbance.  Respiratory:  Negative for cough  and shortness of breath.   Cardiovascular:  Negative for chest pain and palpitations.  Gastrointestinal:  Positive for abdominal pain (lower) and constipation. Negative for blood in stool, diarrhea, nausea and vomiting.  Genitourinary:  Negative for dysuria and hematuria.  Musculoskeletal:  Positive for back pain. Negative for arthralgias.  Skin:  Negative for color change and rash.  Neurological:  Negative for seizures and syncope.  All other systems reviewed and are negative.    Physical Exam Triage Vital Signs ED Triage Vitals  Encounter Vitals Group     BP 09/22/23 1154 120/80     Girls Systolic BP Percentile --      Girls Diastolic BP Percentile --      Boys Systolic BP Percentile --      Boys Diastolic BP Percentile --      Pulse Rate 09/22/23 1154 98     Resp 09/22/23 1154 18     Temp 09/22/23 1154 98.1 F (36.7 C)     Temp Source 09/22/23 1154 Oral     SpO2 09/22/23 1154 95 %      Weight --      Height --      Head Circumference --      Peak Flow --      Pain Score 09/22/23 1152 8     Pain Loc --      Pain Education --      Exclude from Growth Chart --    No data found.  Updated Vital Signs BP 120/80 (BP Location: Left Arm)   Pulse 98   Temp 98.1 F (36.7 C) (Oral)   Resp 18   LMP 03/14/2012   SpO2 95%   Visual Acuity Right Eye Distance:   Left Eye Distance:   Bilateral Distance:    Right Eye Near:   Left Eye Near:    Bilateral Near:     Physical Exam Vitals and nursing note reviewed.  Constitutional:      General: She is not in acute distress.    Appearance: She is well-developed.  HENT:     Head: Normocephalic and atraumatic.     Nose: Nose normal.     Mouth/Throat:     Mouth: Mucous membranes are moist.  Eyes:     Conjunctiva/sclera: Conjunctivae normal.  Cardiovascular:     Rate and Rhythm: Normal rate and regular rhythm.     Heart sounds: No murmur heard. Pulmonary:     Effort: Pulmonary effort is normal. No respiratory distress.     Breath sounds: Normal breath sounds.  Abdominal:     General: Bowel sounds are normal. There is no distension.     Palpations: Abdomen is soft.     Tenderness: There is abdominal tenderness in the left lower quadrant. There is no right CVA tenderness, left CVA tenderness, guarding or rebound. Negative signs include Murphy's sign and McBurney's sign.     Hernia: There is no hernia in the umbilical area or ventral area.  Musculoskeletal:        General: No swelling.     Cervical back: Neck supple.  Skin:    General: Skin is warm and dry.     Capillary Refill: Capillary refill takes less than 2 seconds.  Neurological:     Mental Status: She is alert.  Psychiatric:        Mood and Affect: Mood normal.      UC Treatments / Results  Labs (all labs ordered are listed, but only abnormal  results are displayed) Labs Reviewed  POCT URINALYSIS DIP (MANUAL ENTRY) - Abnormal; Notable for the following  components:      Result Value   Color, UA light yellow (*)    Spec Grav, UA <=1.005 (*)    All other components within normal limits    EKG   Radiology No results found.  Procedures Procedures (including critical care time)  Medications Ordered in UC Medications - No data to display  Initial Impression / Assessment and Plan / UC Course  I have reviewed the triage vital signs and the nursing notes.  Pertinent labs & imaging results that were available during my care of the patient were reviewed by me and considered in my medical decision making (see chart for details).     Strain of lumbar region, initial encounter  Constipation, unspecified constipation type   Mild constipation.  Reassuringly, you are having bowel movements.  Do not suspect obstruction and do not believe more advanced imaging is needed at this time. Physical exam findings are reassuring as well.  We can treat the constipation with an over-the-counter medication to help improve bowel movements.  Urinalysis done today was negative for an infectious process.  Back pain may be secondary to straining to have bwoel movements. We can treat this with a stronger anti-inflammatory. Prednisone 40 mg (2 tablets) once daily for 5 days. Take this in the morning.  This is a steroid to help with inflammation and pain. Light stretching to improve mobility May try heat on the back to improve symptoms.  Miralax 1 scoop mixed in a drink twice daily until having more regular bowel movements then use Miralax as needed for occasional constipation.  Make sure to drink plenty of water daily Return to urgent care or PCP if symptoms worsen or fail to resolve.    Final Clinical Impressions(s) / UC Diagnoses   Final diagnoses:  Strain of lumbar region, initial encounter  Constipation, unspecified constipation type     Discharge Instructions      Mild constipation.  Reassuringly, you are having bowel movements.  Do not suspect  obstruction and do not believe more advanced imaging is needed at this time. Physical exam findings are reassuring as well.  We can treat the constipation with an over-the-counter medication to help improve bowel movements.  Urinalysis done today was negative for an infectious process.  Back pain may be secondary to straining to have bwoel movements. We can treat this with a stronger anti-inflammatory. Prednisone 40 mg (2 tablets) once daily for 5 days. Take this in the morning.  This is a steroid to help with inflammation and pain. Light stretching to improve mobility May try heat on the back to improve symptoms.  Miralax 1 scoop mixed in a drink twice daily until having more regular bowel movements then use Miralax as needed for occasional constipation.  Make sure to drink plenty of water daily Return to urgent care or PCP if symptoms worsen or fail to resolve.      ED Prescriptions     Medication Sig Dispense Auth. Provider   predniSONE (DELTASONE) 20 MG tablet Take 2 tablets (40 mg total) by mouth daily with breakfast for 5 days. 10 tablet Teresa Almarie LABOR, [place]      PDMP not reviewed this encounter.   Teresa Almarie LABOR, [place] 09/22/23 1240

## 2023-09-22 NOTE — Discharge Instructions (Addendum)
 Mild constipation.  Reassuringly, you are having bowel movements.  Do not suspect obstruction and do not believe more advanced imaging is needed at this time. Physical exam findings are reassuring as well.  We can treat the constipation with an over-the-counter medication to help improve bowel movements.  Urinalysis done today was negative for an infectious process.  Back pain may be secondary to straining to have bwoel movements. We can treat this with a stronger anti-inflammatory. Prednisone 40 mg (2 tablets) once daily for 5 days. Take this in the morning.  This is a steroid to help with inflammation and pain. Light stretching to improve mobility May try heat on the back to improve symptoms.  Miralax 1 scoop mixed in a drink twice daily until having more regular bowel movements then use Miralax as needed for occasional constipation.  Make sure to drink plenty of water daily Return to urgent care or PCP if symptoms worsen or fail to resolve.

## 2023-09-22 NOTE — ED Triage Notes (Signed)
 Pt states that she has been having some constipation since Tuesday and she is now developed lower back pain. Last BM was this morning but she states that she feels like she has more stool. She has increased her water intake. She bought a pill at the [place] store that has pain killer and vitamin b.
